# Patient Record
Sex: Male | Born: 1960
Health system: Southern US, Community
[De-identification: ages and names within clinical notes are randomized; demographics above are authoritative.]

## PROBLEM LIST (undated history)

## (undated) DIAGNOSIS — L409 Psoriasis, unspecified: Secondary | ICD-10-CM

## (undated) DIAGNOSIS — L309 Dermatitis, unspecified: Secondary | ICD-10-CM

## (undated) DIAGNOSIS — I10 Essential (primary) hypertension: Secondary | ICD-10-CM

## (undated) HISTORY — DX: Dermatitis, unspecified: L30.9

## (undated) HISTORY — DX: Essential (primary) hypertension: I10

## (undated) HISTORY — PX: WISDOM TOOTH EXTRACTION: SHX21

## (undated) HISTORY — PX: CYST EXCISION: SHX5701

## (undated) HISTORY — DX: Psoriasis, unspecified: L40.9

---

## 2016-08-12 DIAGNOSIS — L218 Other seborrheic dermatitis: Secondary | ICD-10-CM | POA: Diagnosis not present

## 2016-08-12 DIAGNOSIS — L400 Psoriasis vulgaris: Secondary | ICD-10-CM | POA: Diagnosis not present

## 2016-09-16 DIAGNOSIS — J309 Allergic rhinitis, unspecified: Secondary | ICD-10-CM | POA: Diagnosis not present

## 2016-09-16 DIAGNOSIS — Z23 Encounter for immunization: Secondary | ICD-10-CM | POA: Diagnosis not present

## 2016-09-16 DIAGNOSIS — I10 Essential (primary) hypertension: Secondary | ICD-10-CM | POA: Diagnosis not present

## 2016-10-28 ENCOUNTER — Encounter: Payer: Self-pay | Admitting: Allergy and Immunology

## 2016-10-28 ENCOUNTER — Ambulatory Visit (INDEPENDENT_AMBULATORY_CARE_PROVIDER_SITE_OTHER): Payer: BLUE CROSS/BLUE SHIELD | Admitting: Allergy and Immunology

## 2016-10-28 ENCOUNTER — Encounter (INDEPENDENT_AMBULATORY_CARE_PROVIDER_SITE_OTHER): Payer: Self-pay

## 2016-10-28 VITALS — BP 134/78 | HR 88 | Temp 98.3°F | Resp 16 | Ht 71.26 in | Wt 270.6 lb

## 2016-10-28 DIAGNOSIS — J3089 Other allergic rhinitis: Secondary | ICD-10-CM | POA: Diagnosis not present

## 2016-10-28 DIAGNOSIS — J301 Allergic rhinitis due to pollen: Secondary | ICD-10-CM | POA: Diagnosis not present

## 2016-10-28 DIAGNOSIS — T781XXA Other adverse food reactions, not elsewhere classified, initial encounter: Secondary | ICD-10-CM

## 2016-10-28 MED ORDER — AZELASTINE HCL 137 MCG/SPRAY NA SOLN: 2.0000 | Freq: Two times a day (BID) | NASAL | 5 refills | Status: DC | PRN

## 2016-10-28 MED ORDER — OLOPATADINE HCL 0.7 % OP SOLN: 1.0000 [drp] | Freq: Every day | OPHTHALMIC | 5 refills | Status: DC | PRN

## 2016-10-28 MED ORDER — MONTELUKAST SODIUM 10 MG PO TABS: ORAL_TABLET | ORAL | 5 refills | Status: DC

## 2016-10-28 NOTE — Patient Instructions (Addendum)
  1. Allergen avoidance  2. Treat and prevent inflammation:   A. continue nasal fluticasone 1-2 sprays each nostril one time per day  B. start montelukast 10 mg tablet 1 time per day  3. If needed:   A. Azelastine - 1-2 sprays each nostril 1-2 times a day  B. Pazeo - one drop each eye one time per day  4. Consider a course of immunotherapy  5. Return to clinic February 2018 or earlier if problem

## 2016-10-28 NOTE — Progress Notes (Signed)
Dear Dr. Barbaraann Barthelankins,  Thank you for referring Shane Sanchez to the [facility] [place] on 10/28/2016.   Below is a summation of this patient's evaluation and recommendations.  Thank you for your referral. I will keep you informed about this patient's response to treatment.   If you have any questions please do not hesitate to contact me.   Sincerely,  Jessica PriestEric J. Leonia Heatherly, MD [facility]   ______________________________________________________________________    NEW PATIENT NOTE  Referring Provider: Clayborn Heronankins, Victoria R, MD Primary Provider: Clayborn HeronVictoria R Rankins, MD Date of office visit: 10/28/2016    Subjective:   Chief Complaint:  Shane Sanchez (DOB: September 13, 1961) is a 55 y.o. male who presents to the clinic on 10/28/2016 with a chief complaint of Allergic Rhinitis  .     HPI: Shane Sanchez presents to this clinic in evaluation of allergies that a been a long-standing issue but have become progressive over the course of the past several years.  He has nasal congestion and sneezing and nasal itching and itchy red watery eyes and head pressure that appears to flare during the spring and most recently the fall season but also exist on a perennial basis for many years duration. Provoking factors for his symptoms include exposure to pollen and exposure to dust. He has tried antihistamine therapy in the past but each one of these agents appear to cause some degree of sedation. He has tried [employer] Industries[employer]onase which does help his symptoms somewhat yet he still remains symptomatic while utilizing this medication.  In addition, he has issues with mouth tingling after eating bananas and apples and pears and peaches and nectarines. This has been a long-standing issue since early in life. He remains away from these foods and has no problems while doing so. It does appear as though he can eat cooked fruit with no problem. He's never had any  associated systemic or constitutional symptoms associated with these reactions.  Past Medical History:  Diagnosis Date  . Eczema   . High blood pressure   . Psoriasis     Past Surgical History:  Procedure Laterality Date  . CYST EXCISION     Sebaceous cyst in back.  . WISDOM TOOTH EXTRACTION        Medication List      fluticasone 50 MCG/ACT nasal spray Commonly known as:  FLONASE SHAKE LQ AND U 1 SPR IEN QD   losartan-hydrochlorothiazide 100-25 MG tablet Commonly known as:  HYZAAR TK 1 T PO QD   MULTIVITAMIN ADULTS PO Take by mouth.   VITAMIN D PO Take by mouth daily.       No Known Allergies  Review of systems negative except as noted in HPI / PMHx or noted below:  Review of Systems  Constitutional: Negative.   HENT: Negative.   Eyes: Negative.   Respiratory: Negative.   Cardiovascular: Negative.   Gastrointestinal: Negative.   Genitourinary: Negative.   Musculoskeletal: Negative.   Skin: Negative.   Neurological: Negative.   Endo/Heme/Allergies: Negative.   Psychiatric/Behavioral: Negative.     Family History  Problem Relation Age of Onset  . High blood pressure Mother   . Stroke Mother   . Stroke Father   . Heart Problems Father     Social History   Social History  . Marital status: Married    Spouse name: N/A  . Number of children: N/A  . Years of education: N/A  Occupational History  . Not on file.   Social History Main Topics  . Smoking status: Former Smoker    Types: Cigarettes    Quit date: 11/16/1993  . Smokeless tobacco: Never Used  . Alcohol use Yes  . Drug use: No  . Sexual activity: Not on file   Other Topics Concern  . Not on file   Social History Narrative  . No narrative on file    Environmental and Social history  Lives in a house with a dry environment, no animals located inside the household, no carpeting in the bedroom, plastic on the bed and pillow, and no smokers located inside the household. He is a  visiting professor at [employer]  Objective:   Vitals:   10/28/16 0913  BP: 134/78  Pulse: 88  Resp: 16  Temp: 98.3 F (36.8 C)   Height: 5' 11.26" (181 cm) Weight: 270 lb 9.6 oz (122.7 kg)  Physical Exam  Constitutional: He is well-developed, well-nourished, and in no distress.  HENT:  Head: Normocephalic. Head is without right periorbital erythema and without left periorbital erythema.  Right Ear: Tympanic membrane, external ear and ear canal normal.  Left Ear: Tympanic membrane, external ear and ear canal normal.  Nose: Nose normal. No mucosal edema or rhinorrhea.  Mouth/Throat: Oropharynx is clear and moist and mucous membranes are normal. No oropharyngeal exudate.  Eyes: Conjunctivae and lids are normal. Pupils are equal, round, and reactive to light.  Neck: Trachea normal. No tracheal deviation present. No thyromegaly present.  Cardiovascular: Normal rate, regular rhythm, S1 normal, S2 normal and normal heart sounds.   No murmur heard. Pulmonary/Chest: Effort normal. No stridor. No tachypnea. No respiratory distress. He has no wheezes. He has no rales. He exhibits no tenderness.  Abdominal: Soft. He exhibits no distension and no mass. There is no hepatosplenomegaly. There is no tenderness. There is no rebound and no guarding.  Musculoskeletal: He exhibits no edema or tenderness.  Lymphadenopathy:       Head (right side): No tonsillar adenopathy present.       Head (left side): No tonsillar adenopathy present.    He has no cervical adenopathy.    He has no axillary adenopathy.  Neurological: He is alert. Gait normal.  Skin: Rash (Hairline eczematous dermatitis with scale) noted. He is not diaphoretic. No erythema. No pallor. Nails show no clubbing.  Psychiatric: Mood and affect normal.    Diagnostics: Allergy skin tests were performed. He demonstrated severe hypersensitivity against weeds and trees and house dust mite.  Assessment and Plan:    1. Acute seasonal allergic  rhinitis due to pollen   2. Allergic rhinitis due to house dust mite   3. Pollen-food allergy, initial encounter     1. Allergen avoidance  2. Treat and prevent inflammation:   A. continue nasal fluticasone 1-2 sprays each nostril one time per day  B. start montelukast 10 mg tablet 1 time per day  3. If needed:   A. Azelastine - 1-2 sprays each nostril 1-2 times a day  B. Pazeo - one drop each eye one time per day  4. Consider a course of immunotherapy  5. Return to clinic February 2018 or earlier if problem  Hopefully with a combination of allergen avoidance measures and consistent use of anti-inflammatory medications Shane Sanchez will do quite well as he goes through the year. However, I'm not very confident that he is going to have good control of his atopic disease with this plan as  he progresses through this upcoming spring season. He would definitely be a candidate for immunotherapy and I've given him some literature on this form of treatment during today's evaluation. He is presently considering this form of therapy. I'll see him back in this clinic in February or earlier if there is a problem  Jessica Priest, MD [facility]

## 2016-12-30 ENCOUNTER — Ambulatory Visit (INDEPENDENT_AMBULATORY_CARE_PROVIDER_SITE_OTHER): Payer: BLUE CROSS/BLUE SHIELD | Admitting: Allergy and Immunology

## 2016-12-30 ENCOUNTER — Encounter: Payer: Self-pay | Admitting: Allergy and Immunology

## 2016-12-30 ENCOUNTER — Encounter (INDEPENDENT_AMBULATORY_CARE_PROVIDER_SITE_OTHER): Payer: Self-pay

## 2016-12-30 VITALS — BP 118/80 | HR 76 | Resp 16

## 2016-12-30 DIAGNOSIS — T781XXA Other adverse food reactions, not elsewhere classified, initial encounter: Secondary | ICD-10-CM | POA: Diagnosis not present

## 2016-12-30 DIAGNOSIS — J301 Allergic rhinitis due to pollen: Secondary | ICD-10-CM

## 2016-12-30 DIAGNOSIS — J3089 Other allergic rhinitis: Secondary | ICD-10-CM

## 2016-12-30 NOTE — Progress Notes (Signed)
Follow-up Note  Referring Provider: Clayborn Heron, MD Primary Provider: Clayborn Heron, MD Date of Office Visit: 12/30/2016  Subjective:   Shane Sanchez (DOB: 07-09-1961) is a 56 y.o. male who [facility] on 12/30/2016 in re-evaluation of the following:  HPI: Shane Sanchez returns to this clinic in reevaluation of his allergic rhinoconjunctivitis and oral pollinosis syndrome. I last saw him in this clinic on 10/28/2016. He's been using his nasal fluticasone and has also performed some house dust avoidance measures. At this point in time he thinks that he is doing relatively well. He is interested in starting a course immunotherapy because he always has a difficult time during the spring even in the face of utilizing multiple medications.  Allergies as of 12/30/2016   No Known Allergies     Medication List      Azelastine HCl 137 MCG/SPRAY Soln Place 2 sprays into both nostrils 2 (two) times daily as needed.   fluticasone 50 MCG/ACT nasal spray Commonly known as:  FLONASE SHAKE LQ AND U 1 SPR IEN QD   losartan-hydrochlorothiazide 100-25 MG tablet Commonly known as:  HYZAAR TK 1 T PO QD   montelukast 10 MG tablet Commonly known as:  SINGULAIR Take one tablet once daily.   MULTIVITAMIN ADULTS PO Take by mouth.   Olopatadine HCl 0.7 % Soln Commonly known as:  PAZEO Place 1 drop into both eyes daily as needed (for itchy eyes).   vitamin C 250 MG tablet Commonly known as:  ASCORBIC ACID Take 250 mg by mouth daily.   VITAMIN D PO Take by mouth daily.       Past Medical History:  Diagnosis Date  . Eczema   . High blood pressure   . Psoriasis     Past Surgical History:  Procedure Laterality Date  . CYST EXCISION     Sebaceous cyst in back.  . WISDOM TOOTH EXTRACTION      Review of systems negative except as noted in HPI / PMHx or noted below:  Review of Systems  Constitutional: Negative.   HENT: Negative.   Eyes:  Negative.   Respiratory: Negative.   Cardiovascular: Negative.   Gastrointestinal: Negative.   Genitourinary: Negative.   Musculoskeletal: Negative.   Skin: Negative.   Neurological: Negative.   Endo/Heme/Allergies: Negative.   Psychiatric/Behavioral: Negative.      Objective:   Vitals:   12/30/16 1016  BP: 118/80  Pulse: 76  Resp: 16          Physical Exam  Constitutional: He is well-developed, well-nourished, and in no distress.  HENT:  Head: Normocephalic.  Right Ear: Tympanic membrane, external ear and ear canal normal.  Left Ear: Tympanic membrane, external ear and ear canal normal.  Nose: Nose normal. No mucosal edema or rhinorrhea.  Mouth/Throat: Uvula is midline, oropharynx is clear and moist and mucous membranes are normal. No oropharyngeal exudate.  Eyes: Conjunctivae are normal.  Neck: Trachea normal. No tracheal tenderness present. No tracheal deviation present. No thyromegaly present.  Cardiovascular: Normal rate, regular rhythm, S1 normal, S2 normal and normal heart sounds.   No murmur heard. Pulmonary/Chest: Breath sounds normal. No stridor. No respiratory distress. He has no wheezes. He has no rales.  Musculoskeletal: He exhibits no edema.  Lymphadenopathy:       Head (right side): No tonsillar adenopathy present.       Head (left side): No tonsillar adenopathy present.    He has no cervical adenopathy.  Neurological: He is alert. Gait normal.  Skin: No rash noted. He is not diaphoretic. No erythema. Nails show no clubbing.  Psychiatric: Mood and affect normal.    Diagnostics: none   Assessment and Plan:   1. Acute seasonal allergic rhinitis due to pollen   2. Allergic rhinitis due to house dust mite   3. Pollen-food allergy, initial encounter     1. Continue to perform Allergen avoidance measures as best as possible  2. Continue to Treat and prevent inflammation:   A. nasal fluticasone 1-2 sprays each nostril one time per day  B.  montelukast 10 mg tablet 1 time per day  3. If needed:   A. Azelastine - 1-2 sprays each nostril 1-2 times a day  B. Pazeo - one drop each eye one time per day  4. Start a course of immunotherapy  5. Return to clinic 6 months or earlier if problem  Shane Sanchez would definitely be a candidate for immunotherapy given the fact that he has significant problems during pollination season and also has a history of oral pollinosis syndrome that has not responded adequately to good medical treatment. He'll be starting this form of therapy sometime over the course of the next week or so. I will see him back in this clinic in approximately 6 months or earlier if there is a problem.  Laurette SchimkeEric Jasun Gasparini, MD Allergy / Immunology [facility]

## 2016-12-30 NOTE — Patient Instructions (Signed)
  1. Continue to perform Allergen avoidance measures as best as possible  2. Continue to Treat and prevent inflammation:   A. nasal fluticasone 1-2 sprays each nostril one time per day  B. montelukast 10 mg tablet 1 time per day  3. If needed:   A. Azelastine - 1-2 sprays each nostril 1-2 times a day  B. Pazeo - one drop each eye one time per day  4. Start a course of immunotherapy  5. Return to clinic 6 months or earlier if problem

## 2017-01-13 DIAGNOSIS — H40013 Open angle with borderline findings, low risk, bilateral: Secondary | ICD-10-CM | POA: Diagnosis not present

## 2017-01-22 ENCOUNTER — Other Ambulatory Visit: Payer: Self-pay | Admitting: Allergy and Immunology

## 2017-01-22 DIAGNOSIS — J3089 Other allergic rhinitis: Secondary | ICD-10-CM

## 2017-01-22 DIAGNOSIS — J301 Allergic rhinitis due to pollen: Secondary | ICD-10-CM | POA: Diagnosis not present

## 2017-01-23 ENCOUNTER — Encounter: Payer: Self-pay | Admitting: *Deleted

## 2017-01-23 DIAGNOSIS — J3089 Other allergic rhinitis: Secondary | ICD-10-CM | POA: Diagnosis not present

## 2017-01-23 NOTE — Progress Notes (Signed)
4 vial set made. Exp: 01/23/18. hc 

## 2017-01-27 ENCOUNTER — Ambulatory Visit (INDEPENDENT_AMBULATORY_CARE_PROVIDER_SITE_OTHER): Payer: BLUE CROSS/BLUE SHIELD

## 2017-01-27 DIAGNOSIS — J301 Allergic rhinitis due to pollen: Secondary | ICD-10-CM

## 2017-01-27 DIAGNOSIS — J3089 Other allergic rhinitis: Secondary | ICD-10-CM

## 2017-01-27 MED ORDER — EPINEPHRINE 0.3 MG/0.3ML IJ SOAJ: 0.3000 mg | Freq: Once | INTRAMUSCULAR | 1 refills | Status: AC

## 2017-01-27 NOTE — Progress Notes (Signed)
Immunotherapy   Patient Details  Name: Shane Sanchez Spagnolo MRN: [id number] Date of Birth: [birth year]  01/27/2017  Shane ChafePeter Bourquin started injections for  DUST MITE/WEED/TREE Following schedule: B  Frequency:2 times per week Epi-Pen:Prescription for Epi-Pen given Consent signed and patient instructions given.   Dorathy DaftKayla I Zan Triska 01/27/2017, 10:11 AM

## 2017-02-03 ENCOUNTER — Ambulatory Visit (INDEPENDENT_AMBULATORY_CARE_PROVIDER_SITE_OTHER): Payer: BLUE CROSS/BLUE SHIELD | Admitting: *Deleted

## 2017-02-03 DIAGNOSIS — J309 Allergic rhinitis, unspecified: Secondary | ICD-10-CM

## 2017-02-10 ENCOUNTER — Ambulatory Visit (INDEPENDENT_AMBULATORY_CARE_PROVIDER_SITE_OTHER): Payer: BLUE CROSS/BLUE SHIELD | Admitting: *Deleted

## 2017-02-10 DIAGNOSIS — J309 Allergic rhinitis, unspecified: Secondary | ICD-10-CM | POA: Diagnosis not present

## 2017-02-17 ENCOUNTER — Ambulatory Visit (INDEPENDENT_AMBULATORY_CARE_PROVIDER_SITE_OTHER): Payer: BLUE CROSS/BLUE SHIELD | Admitting: *Deleted

## 2017-02-17 DIAGNOSIS — J309 Allergic rhinitis, unspecified: Secondary | ICD-10-CM

## 2017-02-24 ENCOUNTER — Ambulatory Visit (INDEPENDENT_AMBULATORY_CARE_PROVIDER_SITE_OTHER): Payer: BLUE CROSS/BLUE SHIELD | Admitting: *Deleted

## 2017-02-24 DIAGNOSIS — J309 Allergic rhinitis, unspecified: Secondary | ICD-10-CM | POA: Diagnosis not present

## 2017-03-03 ENCOUNTER — Ambulatory Visit (INDEPENDENT_AMBULATORY_CARE_PROVIDER_SITE_OTHER): Payer: BLUE CROSS/BLUE SHIELD | Admitting: *Deleted

## 2017-03-03 DIAGNOSIS — J309 Allergic rhinitis, unspecified: Secondary | ICD-10-CM

## 2017-03-10 ENCOUNTER — Ambulatory Visit (INDEPENDENT_AMBULATORY_CARE_PROVIDER_SITE_OTHER): Payer: BLUE CROSS/BLUE SHIELD | Admitting: *Deleted

## 2017-03-10 DIAGNOSIS — J309 Allergic rhinitis, unspecified: Secondary | ICD-10-CM

## 2017-03-17 ENCOUNTER — Ambulatory Visit (INDEPENDENT_AMBULATORY_CARE_PROVIDER_SITE_OTHER): Payer: BLUE CROSS/BLUE SHIELD | Admitting: *Deleted

## 2017-03-17 DIAGNOSIS — J309 Allergic rhinitis, unspecified: Secondary | ICD-10-CM

## 2017-03-24 ENCOUNTER — Ambulatory Visit (INDEPENDENT_AMBULATORY_CARE_PROVIDER_SITE_OTHER): Payer: BLUE CROSS/BLUE SHIELD | Admitting: *Deleted

## 2017-03-24 DIAGNOSIS — J309 Allergic rhinitis, unspecified: Secondary | ICD-10-CM

## 2017-03-31 ENCOUNTER — Ambulatory Visit (INDEPENDENT_AMBULATORY_CARE_PROVIDER_SITE_OTHER): Payer: BLUE CROSS/BLUE SHIELD

## 2017-03-31 DIAGNOSIS — J309 Allergic rhinitis, unspecified: Secondary | ICD-10-CM

## 2017-04-07 ENCOUNTER — Ambulatory Visit (INDEPENDENT_AMBULATORY_CARE_PROVIDER_SITE_OTHER): Payer: BLUE CROSS/BLUE SHIELD | Admitting: *Deleted

## 2017-04-07 DIAGNOSIS — J309 Allergic rhinitis, unspecified: Secondary | ICD-10-CM

## 2017-04-08 DIAGNOSIS — E782 Mixed hyperlipidemia: Secondary | ICD-10-CM | POA: Diagnosis not present

## 2017-04-08 DIAGNOSIS — I10 Essential (primary) hypertension: Secondary | ICD-10-CM | POA: Diagnosis not present

## 2017-04-08 DIAGNOSIS — J309 Allergic rhinitis, unspecified: Secondary | ICD-10-CM | POA: Diagnosis not present

## 2017-04-08 DIAGNOSIS — L409 Psoriasis, unspecified: Secondary | ICD-10-CM | POA: Diagnosis not present

## 2017-04-08 DIAGNOSIS — Z0001 Encounter for general adult medical examination with abnormal findings: Secondary | ICD-10-CM | POA: Diagnosis not present

## 2017-04-14 ENCOUNTER — Ambulatory Visit (INDEPENDENT_AMBULATORY_CARE_PROVIDER_SITE_OTHER): Payer: BLUE CROSS/BLUE SHIELD

## 2017-04-14 DIAGNOSIS — J309 Allergic rhinitis, unspecified: Secondary | ICD-10-CM | POA: Diagnosis not present

## 2017-04-21 ENCOUNTER — Ambulatory Visit (INDEPENDENT_AMBULATORY_CARE_PROVIDER_SITE_OTHER): Payer: BLUE CROSS/BLUE SHIELD | Admitting: *Deleted

## 2017-04-21 DIAGNOSIS — J309 Allergic rhinitis, unspecified: Secondary | ICD-10-CM | POA: Diagnosis not present

## 2017-04-28 ENCOUNTER — Ambulatory Visit (INDEPENDENT_AMBULATORY_CARE_PROVIDER_SITE_OTHER): Payer: BLUE CROSS/BLUE SHIELD | Admitting: *Deleted

## 2017-04-28 DIAGNOSIS — J309 Allergic rhinitis, unspecified: Secondary | ICD-10-CM

## 2017-05-05 ENCOUNTER — Ambulatory Visit (INDEPENDENT_AMBULATORY_CARE_PROVIDER_SITE_OTHER): Payer: BLUE CROSS/BLUE SHIELD | Admitting: *Deleted

## 2017-05-05 DIAGNOSIS — J309 Allergic rhinitis, unspecified: Secondary | ICD-10-CM

## 2017-05-12 ENCOUNTER — Ambulatory Visit (INDEPENDENT_AMBULATORY_CARE_PROVIDER_SITE_OTHER): Payer: BLUE CROSS/BLUE SHIELD

## 2017-05-12 DIAGNOSIS — J309 Allergic rhinitis, unspecified: Secondary | ICD-10-CM

## 2017-05-19 ENCOUNTER — Ambulatory Visit (INDEPENDENT_AMBULATORY_CARE_PROVIDER_SITE_OTHER): Payer: BLUE CROSS/BLUE SHIELD

## 2017-05-19 DIAGNOSIS — J309 Allergic rhinitis, unspecified: Secondary | ICD-10-CM

## 2017-05-26 ENCOUNTER — Ambulatory Visit (INDEPENDENT_AMBULATORY_CARE_PROVIDER_SITE_OTHER): Payer: BLUE CROSS/BLUE SHIELD | Admitting: *Deleted

## 2017-05-26 DIAGNOSIS — J309 Allergic rhinitis, unspecified: Secondary | ICD-10-CM | POA: Diagnosis not present

## 2017-06-03 ENCOUNTER — Ambulatory Visit (INDEPENDENT_AMBULATORY_CARE_PROVIDER_SITE_OTHER): Payer: BLUE CROSS/BLUE SHIELD

## 2017-06-03 DIAGNOSIS — J309 Allergic rhinitis, unspecified: Secondary | ICD-10-CM

## 2017-06-10 ENCOUNTER — Ambulatory Visit (INDEPENDENT_AMBULATORY_CARE_PROVIDER_SITE_OTHER): Payer: BLUE CROSS/BLUE SHIELD

## 2017-06-10 DIAGNOSIS — J309 Allergic rhinitis, unspecified: Secondary | ICD-10-CM

## 2017-06-18 ENCOUNTER — Ambulatory Visit (INDEPENDENT_AMBULATORY_CARE_PROVIDER_SITE_OTHER): Payer: BLUE CROSS/BLUE SHIELD | Admitting: *Deleted

## 2017-06-18 DIAGNOSIS — J309 Allergic rhinitis, unspecified: Secondary | ICD-10-CM

## 2017-06-26 ENCOUNTER — Ambulatory Visit (INDEPENDENT_AMBULATORY_CARE_PROVIDER_SITE_OTHER): Payer: BLUE CROSS/BLUE SHIELD | Admitting: *Deleted

## 2017-06-26 DIAGNOSIS — J309 Allergic rhinitis, unspecified: Secondary | ICD-10-CM | POA: Diagnosis not present

## 2017-07-02 ENCOUNTER — Ambulatory Visit (INDEPENDENT_AMBULATORY_CARE_PROVIDER_SITE_OTHER): Payer: BLUE CROSS/BLUE SHIELD | Admitting: *Deleted

## 2017-07-02 DIAGNOSIS — J309 Allergic rhinitis, unspecified: Secondary | ICD-10-CM | POA: Diagnosis not present

## 2017-07-03 ENCOUNTER — Other Ambulatory Visit: Payer: Self-pay | Admitting: Allergy and Immunology

## 2017-07-08 ENCOUNTER — Ambulatory Visit (INDEPENDENT_AMBULATORY_CARE_PROVIDER_SITE_OTHER): Payer: BLUE CROSS/BLUE SHIELD | Admitting: *Deleted

## 2017-07-08 DIAGNOSIS — J309 Allergic rhinitis, unspecified: Secondary | ICD-10-CM

## 2017-07-14 ENCOUNTER — Ambulatory Visit (INDEPENDENT_AMBULATORY_CARE_PROVIDER_SITE_OTHER): Payer: BLUE CROSS/BLUE SHIELD

## 2017-07-14 DIAGNOSIS — J309 Allergic rhinitis, unspecified: Secondary | ICD-10-CM | POA: Diagnosis not present

## 2017-07-21 ENCOUNTER — Ambulatory Visit (INDEPENDENT_AMBULATORY_CARE_PROVIDER_SITE_OTHER): Payer: BLUE CROSS/BLUE SHIELD

## 2017-07-21 DIAGNOSIS — J309 Allergic rhinitis, unspecified: Secondary | ICD-10-CM | POA: Diagnosis not present

## 2017-07-23 NOTE — Progress Notes (Signed)
2 Mt vials made Exp. 07/24/18 

## 2017-07-24 DIAGNOSIS — J3089 Other allergic rhinitis: Secondary | ICD-10-CM | POA: Diagnosis not present

## 2017-07-28 ENCOUNTER — Ambulatory Visit (INDEPENDENT_AMBULATORY_CARE_PROVIDER_SITE_OTHER): Payer: BLUE CROSS/BLUE SHIELD | Admitting: *Deleted

## 2017-07-28 DIAGNOSIS — J309 Allergic rhinitis, unspecified: Secondary | ICD-10-CM

## 2017-08-04 ENCOUNTER — Ambulatory Visit (INDEPENDENT_AMBULATORY_CARE_PROVIDER_SITE_OTHER): Payer: BLUE CROSS/BLUE SHIELD | Admitting: *Deleted

## 2017-08-04 DIAGNOSIS — J309 Allergic rhinitis, unspecified: Secondary | ICD-10-CM

## 2017-08-12 ENCOUNTER — Ambulatory Visit (INDEPENDENT_AMBULATORY_CARE_PROVIDER_SITE_OTHER): Payer: BLUE CROSS/BLUE SHIELD

## 2017-08-12 DIAGNOSIS — J309 Allergic rhinitis, unspecified: Secondary | ICD-10-CM | POA: Diagnosis not present

## 2017-08-19 ENCOUNTER — Ambulatory Visit (INDEPENDENT_AMBULATORY_CARE_PROVIDER_SITE_OTHER): Payer: BLUE CROSS/BLUE SHIELD

## 2017-08-19 DIAGNOSIS — J309 Allergic rhinitis, unspecified: Secondary | ICD-10-CM | POA: Diagnosis not present

## 2017-09-09 ENCOUNTER — Ambulatory Visit (INDEPENDENT_AMBULATORY_CARE_PROVIDER_SITE_OTHER): Payer: BLUE CROSS/BLUE SHIELD | Admitting: *Deleted

## 2017-09-09 DIAGNOSIS — J309 Allergic rhinitis, unspecified: Secondary | ICD-10-CM | POA: Diagnosis not present

## 2017-09-15 ENCOUNTER — Ambulatory Visit (INDEPENDENT_AMBULATORY_CARE_PROVIDER_SITE_OTHER): Payer: BLUE CROSS/BLUE SHIELD | Admitting: *Deleted

## 2017-09-15 DIAGNOSIS — J309 Allergic rhinitis, unspecified: Secondary | ICD-10-CM | POA: Diagnosis not present

## 2017-09-22 ENCOUNTER — Ambulatory Visit (INDEPENDENT_AMBULATORY_CARE_PROVIDER_SITE_OTHER): Payer: BLUE CROSS/BLUE SHIELD

## 2017-09-22 DIAGNOSIS — J309 Allergic rhinitis, unspecified: Secondary | ICD-10-CM

## 2017-09-30 ENCOUNTER — Ambulatory Visit (INDEPENDENT_AMBULATORY_CARE_PROVIDER_SITE_OTHER): Payer: BLUE CROSS/BLUE SHIELD | Admitting: *Deleted

## 2017-09-30 DIAGNOSIS — J309 Allergic rhinitis, unspecified: Secondary | ICD-10-CM

## 2017-10-06 ENCOUNTER — Ambulatory Visit (INDEPENDENT_AMBULATORY_CARE_PROVIDER_SITE_OTHER): Payer: BLUE CROSS/BLUE SHIELD

## 2017-10-06 DIAGNOSIS — J309 Allergic rhinitis, unspecified: Secondary | ICD-10-CM

## 2017-10-13 ENCOUNTER — Ambulatory Visit (INDEPENDENT_AMBULATORY_CARE_PROVIDER_SITE_OTHER): Payer: BLUE CROSS/BLUE SHIELD | Admitting: *Deleted

## 2017-10-13 DIAGNOSIS — J309 Allergic rhinitis, unspecified: Secondary | ICD-10-CM

## 2017-10-20 ENCOUNTER — Ambulatory Visit (INDEPENDENT_AMBULATORY_CARE_PROVIDER_SITE_OTHER): Payer: BLUE CROSS/BLUE SHIELD | Admitting: *Deleted

## 2017-10-20 DIAGNOSIS — J309 Allergic rhinitis, unspecified: Secondary | ICD-10-CM | POA: Diagnosis not present

## 2017-10-30 ENCOUNTER — Ambulatory Visit (INDEPENDENT_AMBULATORY_CARE_PROVIDER_SITE_OTHER): Payer: BLUE CROSS/BLUE SHIELD

## 2017-10-30 DIAGNOSIS — J309 Allergic rhinitis, unspecified: Secondary | ICD-10-CM

## 2017-11-04 ENCOUNTER — Encounter: Payer: Self-pay | Admitting: *Deleted

## 2017-11-04 NOTE — Progress Notes (Signed)
VIALS MADE. EXP: 11-04-18. HV 

## 2017-11-06 ENCOUNTER — Ambulatory Visit (INDEPENDENT_AMBULATORY_CARE_PROVIDER_SITE_OTHER): Payer: BLUE CROSS/BLUE SHIELD

## 2017-11-06 DIAGNOSIS — J309 Allergic rhinitis, unspecified: Secondary | ICD-10-CM | POA: Diagnosis not present

## 2017-11-11 DIAGNOSIS — J3089 Other allergic rhinitis: Secondary | ICD-10-CM | POA: Diagnosis not present

## 2017-11-18 ENCOUNTER — Ambulatory Visit (INDEPENDENT_AMBULATORY_CARE_PROVIDER_SITE_OTHER): Payer: BLUE CROSS/BLUE SHIELD | Admitting: *Deleted

## 2017-11-18 DIAGNOSIS — J309 Allergic rhinitis, unspecified: Secondary | ICD-10-CM | POA: Diagnosis not present

## 2017-12-01 ENCOUNTER — Ambulatory Visit (INDEPENDENT_AMBULATORY_CARE_PROVIDER_SITE_OTHER): Payer: BLUE CROSS/BLUE SHIELD | Admitting: *Deleted

## 2017-12-01 DIAGNOSIS — J309 Allergic rhinitis, unspecified: Secondary | ICD-10-CM | POA: Diagnosis not present

## 2017-12-13 ENCOUNTER — Other Ambulatory Visit: Payer: Self-pay | Admitting: Allergy and Immunology

## 2017-12-14 NOTE — Telephone Encounter (Signed)
Courtesy refill  

## 2017-12-15 ENCOUNTER — Ambulatory Visit (INDEPENDENT_AMBULATORY_CARE_PROVIDER_SITE_OTHER): Payer: BLUE CROSS/BLUE SHIELD

## 2017-12-15 DIAGNOSIS — J309 Allergic rhinitis, unspecified: Secondary | ICD-10-CM | POA: Diagnosis not present

## 2017-12-29 ENCOUNTER — Ambulatory Visit (INDEPENDENT_AMBULATORY_CARE_PROVIDER_SITE_OTHER): Payer: BLUE CROSS/BLUE SHIELD | Admitting: *Deleted

## 2017-12-29 DIAGNOSIS — J309 Allergic rhinitis, unspecified: Secondary | ICD-10-CM

## 2018-01-11 ENCOUNTER — Other Ambulatory Visit: Payer: Self-pay | Admitting: Allergy and Immunology

## 2018-01-12 ENCOUNTER — Ambulatory Visit (INDEPENDENT_AMBULATORY_CARE_PROVIDER_SITE_OTHER): Payer: BLUE CROSS/BLUE SHIELD | Admitting: *Deleted

## 2018-01-12 DIAGNOSIS — J309 Allergic rhinitis, unspecified: Secondary | ICD-10-CM | POA: Diagnosis not present

## 2018-01-12 MED ORDER — MONTELUKAST SODIUM 10 MG PO TABS: 10.0000 mg | ORAL_TABLET | Freq: Every day | ORAL | 0 refills | Status: DC

## 2018-01-26 ENCOUNTER — Ambulatory Visit (INDEPENDENT_AMBULATORY_CARE_PROVIDER_SITE_OTHER): Payer: BLUE CROSS/BLUE SHIELD | Admitting: *Deleted

## 2018-01-26 DIAGNOSIS — J309 Allergic rhinitis, unspecified: Secondary | ICD-10-CM | POA: Diagnosis not present

## 2018-02-02 ENCOUNTER — Ambulatory Visit (INDEPENDENT_AMBULATORY_CARE_PROVIDER_SITE_OTHER): Payer: BLUE CROSS/BLUE SHIELD | Admitting: *Deleted

## 2018-02-02 DIAGNOSIS — J309 Allergic rhinitis, unspecified: Secondary | ICD-10-CM | POA: Diagnosis not present

## 2018-02-09 ENCOUNTER — Ambulatory Visit: Payer: BLUE CROSS/BLUE SHIELD | Admitting: Allergy and Immunology

## 2018-02-09 ENCOUNTER — Ambulatory Visit: Payer: Self-pay | Admitting: *Deleted

## 2018-02-09 ENCOUNTER — Encounter: Payer: Self-pay | Admitting: Allergy and Immunology

## 2018-02-09 VITALS — BP 138/84 | HR 88 | Resp 20

## 2018-02-09 DIAGNOSIS — J3089 Other allergic rhinitis: Secondary | ICD-10-CM

## 2018-02-09 DIAGNOSIS — J309 Allergic rhinitis, unspecified: Secondary | ICD-10-CM

## 2018-02-09 MED ORDER — FLUTICASONE PROPIONATE 50 MCG/ACT NA SUSP: 2.0000 | Freq: Every day | NASAL | 11 refills | Status: DC

## 2018-02-09 MED ORDER — MONTELUKAST SODIUM 10 MG PO TABS: 10.0000 mg | ORAL_TABLET | Freq: Every day | ORAL | 11 refills | Status: DC

## 2018-02-09 NOTE — Patient Instructions (Addendum)
  1. Continue to perform Allergen avoidance measures as best as possible  2. Continue to Treat and prevent inflammation:   A. nasal fluticasone 1-2 sprays each nostril one time per day  B. montelukast 10 mg tablet 1 time per day  3. If needed:   A. Azelastine - 1-2 sprays each nostril 1-2 times a day  B. Pazeo - one drop each eye one time per day  C.  OTC antihistamine  4.  Continue immunotherapy and EpiPen/Auvi-Q  5. Return to clinic 12 months or earlier if problem

## 2018-02-09 NOTE — Progress Notes (Signed)
Follow-up Note  Referring Provider: Clayborn Heronankins, Victoria R, MD Primary Provider: Clayborn Heronankins, Victoria R, MD Date of Office Visit: 02/09/2018  Subjective:   Shane Sanchez (DOB: 05-25-1961) is a 57 y.o. male who [facility] on 02/09/2018 in re-evaluation of the following:  HPI: Shane Sanchez returns to this clinic in reevaluation of his allergic rhinoconjunctivitis and oral pollinosis syndrome.  He was last seen in this clinic 30 December 2016 at which point in time he started a course of immunotherapy.  So far he has had an excellent response to immunotherapy and through this early spring he has had no problems at all with his nose and no problems with his eyes.  He consistently uses nasal fluticasone and montelukast and has no need to use any other medications for his atopic disease.  He has not had any adverse effect from his immunotherapy.  Currently he is at every 2-week administration.  Allergies as of 02/09/2018   No Known Allergies     Medication List      atorvastatin 40 MG tablet Commonly known as:  LIPITOR TK 1 T PO QD   fluticasone 50 MCG/ACT nasal spray Commonly known as:  FLONASE SHAKE LQ AND U 1 SPR IEN QD   losartan-hydrochlorothiazide 100-25 MG tablet Commonly known as:  HYZAAR TK 1 T PO QD   montelukast 10 MG tablet Commonly known as:  SINGULAIR Take 1 tablet (10 mg total) by mouth daily.       Past Medical History:  Diagnosis Date  . Eczema   . High blood pressure   . Psoriasis     Past Surgical History:  Procedure Laterality Date  . CYST EXCISION     Sebaceous cyst in back.  . WISDOM TOOTH EXTRACTION      Review of systems negative except as noted in HPI / PMHx or noted below:  Review of Systems  Constitutional: Negative.   HENT: Negative.   Eyes: Negative.   Respiratory: Negative.   Cardiovascular: Negative.   Gastrointestinal: Negative.   Genitourinary: Negative.   Musculoskeletal: Negative.   Skin: Negative.     Neurological: Negative.   Endo/Heme/Allergies: Negative.   Psychiatric/Behavioral: Negative.      Objective:   Vitals:   02/09/18 1552  BP: 138/84  Pulse: 88  Resp: 20          Physical Exam  Constitutional: He is well-developed, well-nourished, and in no distress.  HENT:  Head: Normocephalic.  Right Ear: Tympanic membrane, external ear and ear canal normal.  Left Ear: Tympanic membrane, external ear and ear canal normal.  Nose: Nose normal. No mucosal edema or rhinorrhea.  Mouth/Throat: Uvula is midline, oropharynx is clear and moist and mucous membranes are normal. No oropharyngeal exudate.  Eyes: Conjunctivae are normal.  Neck: Trachea normal. No tracheal tenderness present. No tracheal deviation present. No thyromegaly present.  Cardiovascular: Normal rate, regular rhythm, S1 normal, S2 normal and normal heart sounds.  No murmur heard. Pulmonary/Chest: Breath sounds normal. No stridor. No respiratory distress. He has no wheezes. He has no rales.  Musculoskeletal: He exhibits no edema.  Lymphadenopathy:       Head (right side): No tonsillar adenopathy present.       Head (left side): No tonsillar adenopathy present.    He has no cervical adenopathy.  Neurological: He is alert. Gait normal.  Skin: Rash (Psoriatic lesions scalp, hairline, small patches extremities.) noted. He is not diaphoretic. No erythema. Nails show no clubbing.  Psychiatric: Mood and affect normal.    Diagnostics: none  Assessment and Plan:   1. Other allergic rhinitis     1. Continue to perform Allergen avoidance measures as best as possible  2. Continue to Treat and prevent inflammation:   A. nasal fluticasone 1-2 sprays each nostril one time per day  B. montelukast 10 mg tablet 1 time per day  3. If needed:   A. Azelastine - 1-2 sprays each nostril 1-2 times a day  B. Pazeo - one drop each eye one time per day  C.  OTC antihistamine  4.  Continue immunotherapy and  EpiPen/Auvi-Q  5. Return to clinic 12 months or earlier if problem  [name] appears to be doing very well on his current plan which includes immunotherapy.  I suspect that he will have a relatively good response directed against his atopic disease with this form of treatment.  I will see him back in his clinic in 12 months or earlier if there is a problem.  Laurette Schimke, MD Allergy / Immunology [facility]

## 2018-02-10 ENCOUNTER — Encounter: Payer: Self-pay | Admitting: Allergy and Immunology

## 2018-02-23 ENCOUNTER — Ambulatory Visit (INDEPENDENT_AMBULATORY_CARE_PROVIDER_SITE_OTHER): Payer: BLUE CROSS/BLUE SHIELD | Admitting: *Deleted

## 2018-02-23 DIAGNOSIS — J309 Allergic rhinitis, unspecified: Secondary | ICD-10-CM

## 2018-03-03 ENCOUNTER — Encounter: Payer: Self-pay | Admitting: *Deleted

## 2018-03-03 NOTE — Progress Notes (Signed)
Maintenance vial made. Exp: 03-04-19. hv 

## 2018-03-09 ENCOUNTER — Ambulatory Visit (INDEPENDENT_AMBULATORY_CARE_PROVIDER_SITE_OTHER): Payer: BLUE CROSS/BLUE SHIELD | Admitting: *Deleted

## 2018-03-09 DIAGNOSIS — J309 Allergic rhinitis, unspecified: Secondary | ICD-10-CM

## 2018-03-10 DIAGNOSIS — J3089 Other allergic rhinitis: Secondary | ICD-10-CM | POA: Diagnosis not present

## 2018-03-23 ENCOUNTER — Ambulatory Visit (INDEPENDENT_AMBULATORY_CARE_PROVIDER_SITE_OTHER): Payer: BLUE CROSS/BLUE SHIELD | Admitting: *Deleted

## 2018-03-23 DIAGNOSIS — J309 Allergic rhinitis, unspecified: Secondary | ICD-10-CM | POA: Diagnosis not present

## 2018-03-26 DIAGNOSIS — H40023 Open angle with borderline findings, high risk, bilateral: Secondary | ICD-10-CM | POA: Diagnosis not present

## 2018-04-06 ENCOUNTER — Ambulatory Visit (INDEPENDENT_AMBULATORY_CARE_PROVIDER_SITE_OTHER): Payer: BLUE CROSS/BLUE SHIELD | Admitting: *Deleted

## 2018-04-06 DIAGNOSIS — J309 Allergic rhinitis, unspecified: Secondary | ICD-10-CM

## 2018-04-14 DIAGNOSIS — L409 Psoriasis, unspecified: Secondary | ICD-10-CM | POA: Diagnosis not present

## 2018-04-14 DIAGNOSIS — Z23 Encounter for immunization: Secondary | ICD-10-CM | POA: Diagnosis not present

## 2018-04-14 DIAGNOSIS — E782 Mixed hyperlipidemia: Secondary | ICD-10-CM | POA: Diagnosis not present

## 2018-04-14 DIAGNOSIS — R7301 Impaired fasting glucose: Secondary | ICD-10-CM | POA: Diagnosis not present

## 2018-04-14 DIAGNOSIS — I10 Essential (primary) hypertension: Secondary | ICD-10-CM | POA: Diagnosis not present

## 2018-04-14 DIAGNOSIS — Z0000 Encounter for general adult medical examination without abnormal findings: Secondary | ICD-10-CM | POA: Diagnosis not present

## 2018-04-16 ENCOUNTER — Other Ambulatory Visit: Payer: Self-pay | Admitting: Family Medicine

## 2018-04-16 DIAGNOSIS — N6320 Unspecified lump in the left breast, unspecified quadrant: Secondary | ICD-10-CM

## 2018-04-23 ENCOUNTER — Ambulatory Visit (INDEPENDENT_AMBULATORY_CARE_PROVIDER_SITE_OTHER): Payer: BLUE CROSS/BLUE SHIELD

## 2018-04-23 DIAGNOSIS — J309 Allergic rhinitis, unspecified: Secondary | ICD-10-CM

## 2018-04-26 ENCOUNTER — Ambulatory Visit
Admission: RE | Admit: 2018-04-26 | Discharge: 2018-04-26 | Disposition: A | Discharge status: Home / Self Care | Payer: BLUE CROSS/BLUE SHIELD | Source: Ambulatory Visit | Attending: Family Medicine | Admitting: Family Medicine

## 2018-04-26 DIAGNOSIS — N6320 Unspecified lump in the left breast, unspecified quadrant: Secondary | ICD-10-CM

## 2018-04-26 DIAGNOSIS — N6489 Other specified disorders of breast: Secondary | ICD-10-CM | POA: Diagnosis not present

## 2018-04-26 DIAGNOSIS — R928 Other abnormal and inconclusive findings on diagnostic imaging of breast: Secondary | ICD-10-CM | POA: Diagnosis not present

## 2018-04-30 DIAGNOSIS — E119 Type 2 diabetes mellitus without complications: Secondary | ICD-10-CM | POA: Diagnosis not present

## 2018-05-06 ENCOUNTER — Ambulatory Visit (INDEPENDENT_AMBULATORY_CARE_PROVIDER_SITE_OTHER): Payer: BLUE CROSS/BLUE SHIELD | Admitting: *Deleted

## 2018-05-06 DIAGNOSIS — J309 Allergic rhinitis, unspecified: Secondary | ICD-10-CM | POA: Diagnosis not present

## 2018-05-13 DIAGNOSIS — Z713 Dietary counseling and surveillance: Secondary | ICD-10-CM | POA: Diagnosis not present

## 2018-05-14 ENCOUNTER — Ambulatory Visit (INDEPENDENT_AMBULATORY_CARE_PROVIDER_SITE_OTHER): Payer: BLUE CROSS/BLUE SHIELD

## 2018-05-14 DIAGNOSIS — J309 Allergic rhinitis, unspecified: Secondary | ICD-10-CM

## 2018-05-26 ENCOUNTER — Ambulatory Visit (INDEPENDENT_AMBULATORY_CARE_PROVIDER_SITE_OTHER): Payer: BLUE CROSS/BLUE SHIELD | Admitting: *Deleted

## 2018-05-26 DIAGNOSIS — J309 Allergic rhinitis, unspecified: Secondary | ICD-10-CM

## 2018-06-01 ENCOUNTER — Ambulatory Visit (INDEPENDENT_AMBULATORY_CARE_PROVIDER_SITE_OTHER): Payer: BLUE CROSS/BLUE SHIELD | Admitting: *Deleted

## 2018-06-01 DIAGNOSIS — J309 Allergic rhinitis, unspecified: Secondary | ICD-10-CM

## 2018-06-23 DIAGNOSIS — Z713 Dietary counseling and surveillance: Secondary | ICD-10-CM | POA: Diagnosis not present

## 2018-06-24 ENCOUNTER — Ambulatory Visit (INDEPENDENT_AMBULATORY_CARE_PROVIDER_SITE_OTHER): Payer: BLUE CROSS/BLUE SHIELD | Admitting: *Deleted

## 2018-06-24 DIAGNOSIS — J309 Allergic rhinitis, unspecified: Secondary | ICD-10-CM

## 2018-07-06 ENCOUNTER — Ambulatory Visit (INDEPENDENT_AMBULATORY_CARE_PROVIDER_SITE_OTHER): Payer: BLUE CROSS/BLUE SHIELD | Admitting: *Deleted

## 2018-07-06 DIAGNOSIS — J309 Allergic rhinitis, unspecified: Secondary | ICD-10-CM | POA: Diagnosis not present

## 2018-07-21 ENCOUNTER — Ambulatory Visit (INDEPENDENT_AMBULATORY_CARE_PROVIDER_SITE_OTHER): Payer: BLUE CROSS/BLUE SHIELD

## 2018-07-21 DIAGNOSIS — J309 Allergic rhinitis, unspecified: Secondary | ICD-10-CM

## 2018-08-03 ENCOUNTER — Ambulatory Visit (INDEPENDENT_AMBULATORY_CARE_PROVIDER_SITE_OTHER): Payer: BLUE CROSS/BLUE SHIELD | Admitting: *Deleted

## 2018-08-03 DIAGNOSIS — J309 Allergic rhinitis, unspecified: Secondary | ICD-10-CM

## 2018-08-12 NOTE — Progress Notes (Signed)
Vials exp 08-14-19 

## 2018-08-13 DIAGNOSIS — J3089 Other allergic rhinitis: Secondary | ICD-10-CM | POA: Diagnosis not present

## 2018-08-17 ENCOUNTER — Ambulatory Visit (INDEPENDENT_AMBULATORY_CARE_PROVIDER_SITE_OTHER): Payer: BLUE CROSS/BLUE SHIELD | Admitting: *Deleted

## 2018-08-17 DIAGNOSIS — J309 Allergic rhinitis, unspecified: Secondary | ICD-10-CM

## 2018-08-19 DIAGNOSIS — E119 Type 2 diabetes mellitus without complications: Secondary | ICD-10-CM | POA: Diagnosis not present

## 2018-08-19 DIAGNOSIS — L409 Psoriasis, unspecified: Secondary | ICD-10-CM | POA: Diagnosis not present

## 2018-08-19 DIAGNOSIS — N62 Hypertrophy of breast: Secondary | ICD-10-CM | POA: Diagnosis not present

## 2018-08-19 DIAGNOSIS — Z23 Encounter for immunization: Secondary | ICD-10-CM | POA: Diagnosis not present

## 2018-08-19 DIAGNOSIS — E782 Mixed hyperlipidemia: Secondary | ICD-10-CM | POA: Diagnosis not present

## 2018-09-02 ENCOUNTER — Ambulatory Visit (INDEPENDENT_AMBULATORY_CARE_PROVIDER_SITE_OTHER): Payer: BLUE CROSS/BLUE SHIELD | Admitting: *Deleted

## 2018-09-02 DIAGNOSIS — J309 Allergic rhinitis, unspecified: Secondary | ICD-10-CM | POA: Diagnosis not present

## 2018-09-14 ENCOUNTER — Ambulatory Visit (INDEPENDENT_AMBULATORY_CARE_PROVIDER_SITE_OTHER): Payer: BLUE CROSS/BLUE SHIELD | Admitting: *Deleted

## 2018-09-14 DIAGNOSIS — J309 Allergic rhinitis, unspecified: Secondary | ICD-10-CM

## 2018-09-14 DIAGNOSIS — E291 Testicular hypofunction: Secondary | ICD-10-CM | POA: Diagnosis not present

## 2018-09-14 DIAGNOSIS — N62 Hypertrophy of breast: Secondary | ICD-10-CM | POA: Diagnosis not present

## 2018-09-14 DIAGNOSIS — R7309 Other abnormal glucose: Secondary | ICD-10-CM | POA: Diagnosis not present

## 2018-09-28 ENCOUNTER — Ambulatory Visit (INDEPENDENT_AMBULATORY_CARE_PROVIDER_SITE_OTHER): Payer: BLUE CROSS/BLUE SHIELD | Admitting: *Deleted

## 2018-09-28 DIAGNOSIS — J309 Allergic rhinitis, unspecified: Secondary | ICD-10-CM

## 2018-10-05 DIAGNOSIS — H40023 Open angle with borderline findings, high risk, bilateral: Secondary | ICD-10-CM | POA: Diagnosis not present

## 2018-10-05 DIAGNOSIS — Z86010 Personal history of colonic polyps: Secondary | ICD-10-CM | POA: Diagnosis not present

## 2018-10-05 DIAGNOSIS — Z01818 Encounter for other preprocedural examination: Secondary | ICD-10-CM | POA: Diagnosis not present

## 2018-10-12 ENCOUNTER — Ambulatory Visit (INDEPENDENT_AMBULATORY_CARE_PROVIDER_SITE_OTHER): Payer: BLUE CROSS/BLUE SHIELD | Admitting: *Deleted

## 2018-10-12 DIAGNOSIS — J309 Allergic rhinitis, unspecified: Secondary | ICD-10-CM | POA: Diagnosis not present

## 2018-10-26 ENCOUNTER — Ambulatory Visit (INDEPENDENT_AMBULATORY_CARE_PROVIDER_SITE_OTHER): Payer: BLUE CROSS/BLUE SHIELD | Admitting: *Deleted

## 2018-10-26 DIAGNOSIS — J309 Allergic rhinitis, unspecified: Secondary | ICD-10-CM

## 2018-10-26 DIAGNOSIS — R3911 Hesitancy of micturition: Secondary | ICD-10-CM | POA: Diagnosis not present

## 2018-10-26 DIAGNOSIS — N401 Enlarged prostate with lower urinary tract symptoms: Secondary | ICD-10-CM | POA: Diagnosis not present

## 2018-10-26 DIAGNOSIS — E291 Testicular hypofunction: Secondary | ICD-10-CM | POA: Diagnosis not present

## 2018-10-28 DIAGNOSIS — Z79899 Other long term (current) drug therapy: Secondary | ICD-10-CM | POA: Diagnosis not present

## 2018-10-28 DIAGNOSIS — E291 Testicular hypofunction: Secondary | ICD-10-CM | POA: Diagnosis not present

## 2018-10-28 DIAGNOSIS — R7309 Other abnormal glucose: Secondary | ICD-10-CM | POA: Diagnosis not present

## 2018-10-28 DIAGNOSIS — Z6839 Body mass index (BMI) 39.0-39.9, adult: Secondary | ICD-10-CM | POA: Diagnosis not present

## 2018-10-28 DIAGNOSIS — N62 Hypertrophy of breast: Secondary | ICD-10-CM | POA: Diagnosis not present

## 2018-11-08 ENCOUNTER — Ambulatory Visit (INDEPENDENT_AMBULATORY_CARE_PROVIDER_SITE_OTHER): Payer: BLUE CROSS/BLUE SHIELD

## 2018-11-08 DIAGNOSIS — J309 Allergic rhinitis, unspecified: Secondary | ICD-10-CM

## 2018-11-09 DIAGNOSIS — D123 Benign neoplasm of transverse colon: Secondary | ICD-10-CM | POA: Diagnosis not present

## 2018-11-09 DIAGNOSIS — D122 Benign neoplasm of ascending colon: Secondary | ICD-10-CM | POA: Diagnosis not present

## 2018-11-09 DIAGNOSIS — K644 Residual hemorrhoidal skin tags: Secondary | ICD-10-CM | POA: Diagnosis not present

## 2018-11-09 DIAGNOSIS — Z86010 Personal history of colonic polyps: Secondary | ICD-10-CM | POA: Diagnosis not present

## 2018-11-09 DIAGNOSIS — D125 Benign neoplasm of sigmoid colon: Secondary | ICD-10-CM | POA: Diagnosis not present

## 2018-11-09 DIAGNOSIS — K5730 Diverticulosis of large intestine without perforation or abscess without bleeding: Secondary | ICD-10-CM | POA: Diagnosis not present

## 2018-11-09 DIAGNOSIS — K648 Other hemorrhoids: Secondary | ICD-10-CM | POA: Diagnosis not present

## 2018-11-16 DIAGNOSIS — D122 Benign neoplasm of ascending colon: Secondary | ICD-10-CM | POA: Diagnosis not present

## 2018-11-16 DIAGNOSIS — D125 Benign neoplasm of sigmoid colon: Secondary | ICD-10-CM | POA: Diagnosis not present

## 2018-11-16 DIAGNOSIS — D123 Benign neoplasm of transverse colon: Secondary | ICD-10-CM | POA: Diagnosis not present

## 2018-11-18 DIAGNOSIS — N62 Hypertrophy of breast: Secondary | ICD-10-CM | POA: Diagnosis not present

## 2018-11-18 DIAGNOSIS — R7309 Other abnormal glucose: Secondary | ICD-10-CM | POA: Diagnosis not present

## 2018-11-18 DIAGNOSIS — E291 Testicular hypofunction: Secondary | ICD-10-CM | POA: Diagnosis not present

## 2018-11-23 ENCOUNTER — Ambulatory Visit (INDEPENDENT_AMBULATORY_CARE_PROVIDER_SITE_OTHER): Payer: BLUE CROSS/BLUE SHIELD | Admitting: *Deleted

## 2018-11-23 DIAGNOSIS — J309 Allergic rhinitis, unspecified: Secondary | ICD-10-CM

## 2018-11-24 DIAGNOSIS — Z6839 Body mass index (BMI) 39.0-39.9, adult: Secondary | ICD-10-CM | POA: Diagnosis not present

## 2018-11-24 DIAGNOSIS — E291 Testicular hypofunction: Secondary | ICD-10-CM | POA: Diagnosis not present

## 2018-11-24 DIAGNOSIS — N62 Hypertrophy of breast: Secondary | ICD-10-CM | POA: Diagnosis not present

## 2018-11-24 DIAGNOSIS — R7309 Other abnormal glucose: Secondary | ICD-10-CM | POA: Diagnosis not present

## 2018-12-09 ENCOUNTER — Ambulatory Visit (INDEPENDENT_AMBULATORY_CARE_PROVIDER_SITE_OTHER): Payer: BLUE CROSS/BLUE SHIELD

## 2018-12-09 DIAGNOSIS — J309 Allergic rhinitis, unspecified: Secondary | ICD-10-CM | POA: Diagnosis not present

## 2018-12-21 ENCOUNTER — Ambulatory Visit (INDEPENDENT_AMBULATORY_CARE_PROVIDER_SITE_OTHER): Payer: BLUE CROSS/BLUE SHIELD

## 2018-12-21 DIAGNOSIS — J309 Allergic rhinitis, unspecified: Secondary | ICD-10-CM

## 2019-01-05 ENCOUNTER — Ambulatory Visit (INDEPENDENT_AMBULATORY_CARE_PROVIDER_SITE_OTHER): Payer: BLUE CROSS/BLUE SHIELD | Admitting: *Deleted

## 2019-01-05 DIAGNOSIS — J309 Allergic rhinitis, unspecified: Secondary | ICD-10-CM

## 2019-01-05 NOTE — Progress Notes (Signed)
VIALS EXP 2-29-2021

## 2019-01-06 ENCOUNTER — Other Ambulatory Visit: Payer: Self-pay | Admitting: Internal Medicine

## 2019-01-06 DIAGNOSIS — Z8639 Personal history of other endocrine, nutritional and metabolic disease: Secondary | ICD-10-CM

## 2019-01-06 DIAGNOSIS — J3089 Other allergic rhinitis: Secondary | ICD-10-CM | POA: Diagnosis not present

## 2019-01-16 ENCOUNTER — Other Ambulatory Visit: Payer: Self-pay | Admitting: Allergy and Immunology

## 2019-01-17 ENCOUNTER — Other Ambulatory Visit: Payer: Self-pay | Admitting: Allergy and Immunology

## 2019-01-17 MED ORDER — MONTELUKAST SODIUM 10 MG PO TABS: ORAL_TABLET | ORAL | 0 refills | Status: DC

## 2019-01-17 MED ORDER — FLUTICASONE PROPIONATE 50 MCG/ACT NA SUSP: 2.0000 | Freq: Every day | NASAL | 0 refills | Status: DC

## 2019-01-17 NOTE — Telephone Encounter (Signed)
Refills sent in wife notified

## 2019-01-17 NOTE — Telephone Encounter (Signed)
Patients wife is calling stating that patient needs refills on montelukast and flonase called into the [facility] [address]  Patient does have an upcoming appt on 03/17 for yearly and allergy injections

## 2019-01-18 ENCOUNTER — Ambulatory Visit (INDEPENDENT_AMBULATORY_CARE_PROVIDER_SITE_OTHER): Payer: BLUE CROSS/BLUE SHIELD | Admitting: *Deleted

## 2019-01-18 DIAGNOSIS — J309 Allergic rhinitis, unspecified: Secondary | ICD-10-CM

## 2019-02-01 ENCOUNTER — Ambulatory Visit (INDEPENDENT_AMBULATORY_CARE_PROVIDER_SITE_OTHER): Payer: BLUE CROSS/BLUE SHIELD | Admitting: Allergy and Immunology

## 2019-02-01 ENCOUNTER — Encounter: Payer: Self-pay | Admitting: Allergy and Immunology

## 2019-02-01 ENCOUNTER — Other Ambulatory Visit: Payer: Self-pay

## 2019-02-01 VITALS — BP 138/74 | HR 90 | Resp 18 | Ht 71.5 in | Wt 285.0 lb

## 2019-02-01 DIAGNOSIS — J3089 Other allergic rhinitis: Secondary | ICD-10-CM | POA: Diagnosis not present

## 2019-02-01 DIAGNOSIS — J301 Allergic rhinitis due to pollen: Secondary | ICD-10-CM | POA: Diagnosis not present

## 2019-02-01 DIAGNOSIS — T781XXD Other adverse food reactions, not elsewhere classified, subsequent encounter: Secondary | ICD-10-CM | POA: Diagnosis not present

## 2019-02-01 MED ORDER — FLUTICASONE PROPIONATE 50 MCG/ACT NA SUSP: 2.0000 | Freq: Every day | NASAL | 2 refills | Status: DC

## 2019-02-01 MED ORDER — MONTELUKAST SODIUM 10 MG PO TABS: ORAL_TABLET | ORAL | 2 refills | Status: DC

## 2019-02-01 NOTE — Progress Notes (Signed)
[name] [facility] [place] [facility] [place] [facility] [place] [facility] [place]   Follow-up Note  Referring Provider: Clayborn Heron, MD Primary Provider: Clayborn Heron, MD Date of Office Visit: 02/01/2019  Subjective:   Shane Sanchez (DOB: July 18, 1961) is a 58 y.o. male who returns to the [facility] on 02/01/2019 in re-evaluation of the following:  HPI: Shane Sanchez presents to this clinic in evaluation of allergic rhinoconjunctivitis and oral allergy syndrome treated with immunotherapy.  His last visit to this clinic was 09 February 2018.  Overall he has done very well with his allergic rhinoconjunctivitis while using immunotherapy and has not had any new adverse effect from utilizing this form of treatment.  Currently has treatment is every 2 weeks.  He has noticed for the past 2 weeks has had an episode of sneezing and some nasal pressure and a relatively mild sore throat without any other symptoms including fever or ugly nasal discharge or cough.  This occurs even though he consistently uses his Flonase and his montelukast.  He has not added in any additional antihistamine at this point in time.  Since starting his immunotherapy he can now eat apples without any problem at all.  He did obtain the flu vaccine this year.  Allergies as of 02/01/2019   No Known Allergies     Medication List      atorvastatin 40 MG tablet Commonly known as:  LIPITOR TK 1 T PO QD   fluticasone 50 MCG/ACT nasal spray Commonly known as:  FLONASE Place 2 sprays into both nostrils daily.   losartan-hydrochlorothiazide 100-25 MG tablet Commonly known as:  HYZAAR TK 1 T PO QD   montelukast 10 MG tablet Commonly known as:  SINGULAIR TAKE 1 TABLET(10 MG) BY MOUTH DAILY       Past Medical History:  Diagnosis Date  . Eczema   . High blood pressure   . Psoriasis     Past Surgical History:  Procedure Laterality Date  . CYST EXCISION     Sebaceous cyst in back.  . WISDOM TOOTH EXTRACTION      Review of systems negative except as noted in HPI / PMHx or noted below:  Review of Systems  Constitutional: Negative.   HENT: Negative.   Eyes: Negative.   Respiratory: Negative.   Cardiovascular: Negative.   Gastrointestinal: Negative.   Genitourinary: Negative.   Musculoskeletal: Negative.   Skin: Negative.   Neurological: Negative.   Endo/Heme/Allergies: Negative.   Psychiatric/Behavioral: Negative.      Objective:   Vitals:   02/01/19 1746  BP: 138/74  Pulse: 90  Resp: 18  SpO2: 98%   Height: 5' 11.5" (181.6 cm)  Weight: 285 lb (129.3 kg)   Physical Exam Constitutional:      Appearance: He is not diaphoretic.  HENT:     Head: Normocephalic.     Right Ear: Tympanic membrane, ear canal and external ear normal.     Left Ear: Tympanic membrane, ear canal and external ear normal.     Nose: Nose normal. No mucosal edema or rhinorrhea.     Mouth/Throat:     Pharynx: Uvula midline. No oropharyngeal exudate.  Eyes:     Conjunctiva/sclera: Conjunctivae normal.  Neck:     Thyroid: No thyromegaly.     Trachea: Trachea normal. No tracheal tenderness or tracheal deviation.  Cardiovascular:     Rate and Rhythm: Normal rate and regular rhythm.     Heart sounds: Normal heart sounds, S1 normal and S2  normal. No murmur.  Pulmonary:     Effort: No respiratory distress.     Breath sounds: Normal breath sounds. No stridor. No wheezing or rales.  Lymphadenopathy:     Head:     Right side of head: No tonsillar adenopathy.     Left side of head: No tonsillar adenopathy.     Cervical: No cervical adenopathy.  Skin:    Nails: There is no clubbing.   Neurological:     Mental Status: He is alert.     Diagnostics: none  Assessment and Plan:   1. Perennial allergic rhinitis   2. Seasonal allergic rhinitis due to pollen   3. Pollen-food allergy, subsequent encounter     1. Continue to perform Allergen avoidance measures as best as possible  2. Continue to Treat and  prevent inflammation:   A. nasal fluticasone 1-2 sprays each nostril one time per day  B. montelukast 10 mg tablet 1 time per day  3. If needed:   A. OTC antihistamine  4.  Continue immunotherapy and EpiPen/Auvi-Q  5. Return to clinic 12 months or earlier if problem  [name] appears to be doing quite well on his current plan and he will remain on immunotherapy and anti-inflammatory agents for his airway and we will see him back in this clinic in approximately 12 months or earlier if there is a problem.  Laurette Schimke, MD Allergy / Immunology [facility]

## 2019-02-01 NOTE — Patient Instructions (Addendum)
  1. Continue to perform Allergen avoidance measures as best as possible  2. Continue to Treat and prevent inflammation:   A. nasal fluticasone 1-2 sprays each nostril one time per day  B. montelukast 10 mg tablet 1 time per day  3. If needed:   A. OTC antihistamine  4.  Continue immunotherapy and EpiPen/Auvi-Q  5. Return to clinic 12 months or earlier if problem

## 2019-02-02 ENCOUNTER — Encounter: Payer: Self-pay | Admitting: Allergy and Immunology

## 2019-02-08 ENCOUNTER — Ambulatory Visit (INDEPENDENT_AMBULATORY_CARE_PROVIDER_SITE_OTHER): Payer: BLUE CROSS/BLUE SHIELD | Admitting: *Deleted

## 2019-02-08 DIAGNOSIS — J3089 Other allergic rhinitis: Secondary | ICD-10-CM | POA: Diagnosis not present

## 2019-02-22 ENCOUNTER — Ambulatory Visit (INDEPENDENT_AMBULATORY_CARE_PROVIDER_SITE_OTHER): Payer: BLUE CROSS/BLUE SHIELD | Admitting: *Deleted

## 2019-02-22 DIAGNOSIS — J309 Allergic rhinitis, unspecified: Secondary | ICD-10-CM | POA: Diagnosis not present

## 2019-03-07 ENCOUNTER — Ambulatory Visit (INDEPENDENT_AMBULATORY_CARE_PROVIDER_SITE_OTHER): Payer: BLUE CROSS/BLUE SHIELD

## 2019-03-07 DIAGNOSIS — J309 Allergic rhinitis, unspecified: Secondary | ICD-10-CM | POA: Diagnosis not present

## 2019-03-18 ENCOUNTER — Ambulatory Visit (INDEPENDENT_AMBULATORY_CARE_PROVIDER_SITE_OTHER): Payer: BLUE CROSS/BLUE SHIELD | Admitting: *Deleted

## 2019-03-18 DIAGNOSIS — J309 Allergic rhinitis, unspecified: Secondary | ICD-10-CM

## 2019-04-04 ENCOUNTER — Ambulatory Visit (INDEPENDENT_AMBULATORY_CARE_PROVIDER_SITE_OTHER): Payer: BLUE CROSS/BLUE SHIELD

## 2019-04-04 DIAGNOSIS — J309 Allergic rhinitis, unspecified: Secondary | ICD-10-CM | POA: Diagnosis not present

## 2019-04-26 ENCOUNTER — Ambulatory Visit (INDEPENDENT_AMBULATORY_CARE_PROVIDER_SITE_OTHER): Payer: BLUE CROSS/BLUE SHIELD | Admitting: *Deleted

## 2019-04-26 DIAGNOSIS — J309 Allergic rhinitis, unspecified: Secondary | ICD-10-CM

## 2019-05-09 DIAGNOSIS — I10 Essential (primary) hypertension: Secondary | ICD-10-CM | POA: Diagnosis not present

## 2019-05-09 DIAGNOSIS — E118 Type 2 diabetes mellitus with unspecified complications: Secondary | ICD-10-CM | POA: Diagnosis not present

## 2019-05-09 DIAGNOSIS — E782 Mixed hyperlipidemia: Secondary | ICD-10-CM | POA: Diagnosis not present

## 2019-05-09 DIAGNOSIS — Z0000 Encounter for general adult medical examination without abnormal findings: Secondary | ICD-10-CM | POA: Diagnosis not present

## 2019-05-09 DIAGNOSIS — F1099 Alcohol use, unspecified with unspecified alcohol-induced disorder: Secondary | ICD-10-CM | POA: Diagnosis not present

## 2019-05-09 DIAGNOSIS — Z125 Encounter for screening for malignant neoplasm of prostate: Secondary | ICD-10-CM | POA: Diagnosis not present

## 2019-05-09 DIAGNOSIS — Z23 Encounter for immunization: Secondary | ICD-10-CM | POA: Diagnosis not present

## 2019-05-18 ENCOUNTER — Ambulatory Visit (INDEPENDENT_AMBULATORY_CARE_PROVIDER_SITE_OTHER): Payer: BLUE CROSS/BLUE SHIELD | Admitting: *Deleted

## 2019-05-18 DIAGNOSIS — J309 Allergic rhinitis, unspecified: Secondary | ICD-10-CM

## 2019-05-25 DIAGNOSIS — J3089 Other allergic rhinitis: Secondary | ICD-10-CM | POA: Diagnosis not present

## 2019-05-25 NOTE — Progress Notes (Signed)
VIALS EXP 05-24-2020 

## 2019-06-07 ENCOUNTER — Ambulatory Visit (INDEPENDENT_AMBULATORY_CARE_PROVIDER_SITE_OTHER): Payer: BC Managed Care – PPO

## 2019-06-07 DIAGNOSIS — J309 Allergic rhinitis, unspecified: Secondary | ICD-10-CM | POA: Diagnosis not present

## 2019-06-28 ENCOUNTER — Ambulatory Visit (INDEPENDENT_AMBULATORY_CARE_PROVIDER_SITE_OTHER): Payer: BC Managed Care – PPO | Admitting: *Deleted

## 2019-06-28 DIAGNOSIS — J309 Allergic rhinitis, unspecified: Secondary | ICD-10-CM

## 2019-07-19 ENCOUNTER — Ambulatory Visit (INDEPENDENT_AMBULATORY_CARE_PROVIDER_SITE_OTHER): Payer: BC Managed Care – PPO | Admitting: *Deleted

## 2019-07-19 ENCOUNTER — Other Ambulatory Visit: Payer: Self-pay

## 2019-07-19 DIAGNOSIS — J309 Allergic rhinitis, unspecified: Secondary | ICD-10-CM

## 2019-07-21 ENCOUNTER — Encounter: Payer: Self-pay | Admitting: Internal Medicine

## 2019-07-21 ENCOUNTER — Other Ambulatory Visit: Payer: Self-pay

## 2019-07-21 ENCOUNTER — Ambulatory Visit: Payer: BC Managed Care – PPO | Admitting: Internal Medicine

## 2019-07-21 VITALS — BP 118/64 | HR 86 | Temp 98.3°F | Ht 72.0 in | Wt 282.6 lb

## 2019-07-21 DIAGNOSIS — N62 Hypertrophy of breast: Secondary | ICD-10-CM | POA: Diagnosis not present

## 2019-07-21 NOTE — Progress Notes (Signed)
Name: Shane Sanchez  MRN/ DOB: [id number], [birth year]    Age/ Sex: 58 y.o., male    PCP: Cari Caraway, MD   Reason for Endocrinology Evaluation: Hypogonadism / gynecomastia      Date of Initial Endocrinology Evaluation: 07/21/2019     HPI: Shane Sanchez is a 58 y.o. male with a past medical history of HTN and T2DM . The patient presented for initial endocrinology clinic visit on 07/21/2019 for consultative assistance with his Gynecomastia   Pt is here with his wife today  Pt noted a left lump under the nipple ~ >1  yr ago ( sometime in 03/2018), associated with sensitivity but does not recall having a nipple discharge  Denies decreased shaving   Can not tell if  there's a decrease in testicular  size   No change in libido , no erectile dysfunction   Saw urology and confirmed prostate hypertrophy   Breast mammo and ultrasound showed gynecomastia 6.2019   Drinks a bottle and half a day ( wine) daily   Pt is a college professor   HISTORY:  Past Medical History:  Past Medical History:  Diagnosis Date  . Eczema   . High blood pressure   . Psoriasis    Past Surgical History:  Past Surgical History:  Procedure Laterality Date  . CYST EXCISION     Sebaceous cyst in back.  . WISDOM TOOTH EXTRACTION        Social History:  reports that he quit smoking about 25 years ago. His smoking use included cigarettes. He has never used smokeless tobacco. He reports current alcohol use. He reports that he does not use drugs.  Family History: family history includes Heart Problems in his father; High blood pressure in his mother; Stroke in his father and mother.   HOME MEDICATIONS: Allergies as of 07/21/2019   No Known Allergies     Medication List       Accurate as of July 21, 2019  1:39 PM. If you have any questions, ask your nurse or doctor.        atorvastatin 40 MG tablet Commonly known as: LIPITOR TK 1 T PO QD   fluticasone 50 MCG/ACT nasal spray  Commonly known as: FLONASE Place 2 sprays into both nostrils daily.   losartan-hydrochlorothiazide 100-25 MG tablet Commonly known as: HYZAAR TK 1 T PO QD   metFORMIN 500 MG tablet Commonly known as: GLUCOPHAGE TK 1 T PO QD WAC   montelukast 10 MG tablet Commonly known as: SINGULAIR TAKE 1 TABLET(10 MG) BY MOUTH DAILY         REVIEW OF SYSTEMS: A comprehensive ROS was conducted with the patient and is negative except as per HPI and below:  Review of Systems  Constitutional: Negative for chills and fever.  HENT: Negative for nosebleeds and sore throat.   Eyes: Negative for blurred vision and pain.  Respiratory: Negative for cough and shortness of breath.   Cardiovascular: Negative for chest pain and palpitations.  Gastrointestinal: Negative for diarrhea and nausea.  Genitourinary: Positive for frequency.  Neurological: Negative for tingling and tremors.  Endo/Heme/Allergies: Negative for polydipsia.       OBJECTIVE:  VS: BP 118/64 (BP Location: Left Arm, Patient Position: Sitting, Cuff Size: Large)   Pulse 86   Temp 98.3 F (36.8 C)   Ht 6' (1.829 m)   Wt 282 lb 9.6 oz (128.2 kg)   SpO2 96%   BMI 38.33 kg/m  Wt Readings from Last 3 Encounters:  07/21/19 282 lb 9.6 oz (128.2 kg)  02/01/19 285 lb (129.3 kg)  10/28/16 270 lb 9.6 oz (122.7 kg)    EXAM: General: Pt appears well and is in NAD  Hydration: Well-hydrated with moist mucous membranes and good skin turgor  Eyes: External eye exam normal without stare, lid lag or exophthalmos.  EOM intact.   Ears, Nose, Throat: Hearing: Grossly intact bilaterally Dental: Good dentition  Throat: Clear without mass, erythema or exudate  Neck: General: Supple without adenopathy. Thyroid: Thyroid size normal.  No goiter or nodules appreciated. No thyroid bruit.  Breast exam:  Mild gynecomastia on the left, no glandular tissue on the right   Lungs: Clear with good BS bilat with no rales, rhonchi, or wheezes  Heart:  Auscultation: RRR.  Abdomen: Normoactive bowel sounds, soft, nontender, without masses or organomegaly palpable  Genital : Benign penile exam, right testicle 20 mL, left testicle 25mL   Extremities: BL LE: Trace pretibial edema  Skin: Hair: Texture and amount normal with gender appropriate distribution Skin Inspection: No rashes Skin Palpation: Skin temperature, texture, and thickness normal to palpation  Neuro: Cranial nerves: II - XII grossly intact  Motor: Normal strength throughout DTRs: 2+ and symmetric in UE without delay in relaxation phase  Mental Status: Judgment, insight: Intact Orientation: Oriented to time, place, and person Mood and affect: No depression, anxiety, or agitation     DATA REVIEWED: 08/20/19  Testosterone 151.3 ng/dL  PSA 1.300.52 ng/mL     ASSESSMENT/PLAN/RECOMMENDATIONS:   1. Gynecomastia :   - We discussed that gynecomastia is due to an imbalance between  estrogen and testosterone ratio in males.  - He did see an endocrinologist last year (these records are nor available)  - We did discuss the role of adipose tissue and the role of excessive alcohol intake in extraglandular aromatization of testosterone to estrogen.  - We discussed the need to repeat hormonal testing , we discussed the endocrine guidelines in testing early in the morning and in the fasting status.  - If there's an underlying hormonal excess or deficiency, this will be treated appropriately but as far as gynecomastia , once its been there for > 1 yr, there's fibrotic tissue and will not disappear unless surgically removed.  - We discussed side effects of testosterone therapy such as erythropoiesis, worsening of sleep apnea that is severe and untreated,  Prostate volumes and serum PSA increase in response to testosterone treatment which might increase BPH and worsen urinary outflow obstruction as well as prostate cancer risk.  - We also discussed there is a possibility of increased  cardiovascular risk associated with testosterone use.   Pt will stop by for repeat labs at his convenience    F/u in 3 months   Signed electronically by: Lyndle HerrlichAbby Jaralla Elo Marmolejos, MD  [facility]  [facility] [address] [place], [place] [address] Phone: [phone] FAX: [phone]   CC: Gweneth DimitriMcNeill, Wendy, MD [address] [place] [address] Phone: [phone] Fax: [phone]   Return to Endocrinology clinic as below: Future Appointments  Date Time Provider Department Center  07/26/2019 11:45 AM [facility] None  10/20/2019 10:30 AM [place] Brasington, Konrad DoloresIbtehal Jaralla, MD [facility] None

## 2019-07-21 NOTE — Patient Instructions (Signed)
-   Please stop by the lab at 8 AM , fasting.

## 2019-07-22 ENCOUNTER — Encounter: Payer: Self-pay | Admitting: Internal Medicine

## 2019-07-26 ENCOUNTER — Ambulatory Visit (INDEPENDENT_AMBULATORY_CARE_PROVIDER_SITE_OTHER): Payer: BC Managed Care – PPO | Admitting: *Deleted

## 2019-07-26 ENCOUNTER — Other Ambulatory Visit (INDEPENDENT_AMBULATORY_CARE_PROVIDER_SITE_OTHER): Payer: BC Managed Care – PPO

## 2019-07-26 ENCOUNTER — Other Ambulatory Visit: Payer: Self-pay

## 2019-07-26 DIAGNOSIS — J309 Allergic rhinitis, unspecified: Secondary | ICD-10-CM

## 2019-07-26 DIAGNOSIS — N62 Hypertrophy of breast: Secondary | ICD-10-CM

## 2019-07-26 LAB — T4, FREE: Free T4: 0.64 ng/dL (ref 0.60–1.60)

## 2019-07-26 LAB — CBC WITH DIFFERENTIAL/PLATELET
Basophils Absolute: 0 10*3/uL (ref 0.0–0.1)
Basophils Relative: 0.6 % (ref 0.0–3.0)
Eosinophils Absolute: 0.1 10*3/uL (ref 0.0–0.7)
Eosinophils Relative: 1 % (ref 0.0–5.0)
HCT: 43.8 % (ref 39.0–52.0)
Hemoglobin: 14.7 g/dL (ref 13.0–17.0)
Lymphocytes Relative: 35.8 % (ref 12.0–46.0)
Lymphs Abs: 2.7 10*3/uL (ref 0.7–4.0)
MCHC: 33.5 g/dL (ref 30.0–36.0)
MCV: 93.4 fl (ref 78.0–100.0)
Monocytes Absolute: 0.7 10*3/uL (ref 0.1–1.0)
Monocytes Relative: 9 % (ref 3.0–12.0)
Neutro Abs: 4 10*3/uL (ref 1.4–7.7)
Neutrophils Relative %: 53.6 % (ref 43.0–77.0)
Platelets: 217 10*3/uL (ref 150.0–400.0)
RBC: 4.69 Mil/uL (ref 4.22–5.81)
RDW: 13.3 % (ref 11.5–15.5)
WBC: 7.4 10*3/uL (ref 4.0–10.5)

## 2019-07-26 LAB — LUTEINIZING HORMONE: LH: 2.68 m[IU]/mL (ref 1.50–9.30)

## 2019-07-26 LAB — COMPREHENSIVE METABOLIC PANEL
ALT: 39 U/L (ref 0–53)
AST: 39 U/L — ABNORMAL HIGH (ref 0–37)
Albumin: 4.4 g/dL (ref 3.5–5.2)
Alkaline Phosphatase: 74 U/L (ref 39–117)
BUN: 15 mg/dL (ref 6–23)
CO2: 27 mEq/L (ref 19–32)
Calcium: 9.6 mg/dL (ref 8.4–10.5)
Chloride: 97 mEq/L (ref 96–112)
Creatinine, Ser: 0.95 mg/dL (ref 0.40–1.50)
GFR: 81.35 mL/min (ref >60.00)
Glucose, Bld: 133 mg/dL — ABNORMAL HIGH (ref 70–99)
Potassium: 4.2 mEq/L (ref 3.5–5.1)
Sodium: 135 mEq/L (ref 135–145)
Total Bilirubin: 0.6 mg/dL (ref 0.2–1.2)
Total Protein: 7.8 g/dL (ref 6.0–8.3)

## 2019-07-26 LAB — FOLLICLE STIMULATING HORMONE: FSH: 3.6 m[IU]/mL (ref 1.4–18.1)

## 2019-07-26 LAB — TSH: TSH: 2.93 u[IU]/mL (ref 0.35–4.50)

## 2019-08-01 LAB — TESTOSTERONE, TOTAL, LC/MS/MS: Testosterone, total: 222.7 ng/dL — ABNORMAL LOW (ref 264.0–916.0)

## 2019-08-01 LAB — PROLACTIN: Prolactin: 14.3 ng/mL (ref 4.0–15.2)

## 2019-08-01 LAB — ESTRADIOL: Estradiol: 43.7 pg/mL — ABNORMAL HIGH (ref 7.6–42.6)

## 2019-08-01 LAB — TESTOSTERONE FREE MS/DIALYSIS
Free Testosterone, Serum: 40 pg/mL — ABNORMAL LOW
Testosterone, Serum (Total): 221 ng/dL — ABNORMAL LOW
Testosterone-% Free: 1.8 %

## 2019-08-03 ENCOUNTER — Ambulatory Visit (INDEPENDENT_AMBULATORY_CARE_PROVIDER_SITE_OTHER): Payer: BC Managed Care – PPO | Admitting: *Deleted

## 2019-08-03 ENCOUNTER — Telehealth: Payer: Self-pay | Admitting: Internal Medicine

## 2019-08-03 DIAGNOSIS — J309 Allergic rhinitis, unspecified: Secondary | ICD-10-CM | POA: Diagnosis not present

## 2019-08-03 DIAGNOSIS — N62 Hypertrophy of breast: Secondary | ICD-10-CM

## 2019-08-03 NOTE — Telephone Encounter (Signed)
Attempted to call the number in the message but it was the wrong number.    Called [phone] spoke to the patient in the presence of his wife He verified his DOB  We discussed the mild but elevated estradiol level, we also discussed low testosterone  We discussed proceed with testicular ultrasound, if this is negative will proceed with adrenal imaging , once tumors have been ruled out will discuss treatment options.   They both expressed understanding     Abby Nena Jordan, MD  [facility] Endocrinology  [facility] [address] [place], [place] [address] Phone: [phone] FAX: [phone]

## 2019-08-03 NOTE — Telephone Encounter (Signed)
Patients wife is stated for Dr.Shamleffer to call the patient at this number [phone]  Thanks

## 2019-08-09 ENCOUNTER — Ambulatory Visit (INDEPENDENT_AMBULATORY_CARE_PROVIDER_SITE_OTHER): Payer: BC Managed Care – PPO | Admitting: *Deleted

## 2019-08-09 DIAGNOSIS — J309 Allergic rhinitis, unspecified: Secondary | ICD-10-CM

## 2019-08-15 DIAGNOSIS — I10 Essential (primary) hypertension: Secondary | ICD-10-CM | POA: Diagnosis not present

## 2019-08-15 DIAGNOSIS — N62 Hypertrophy of breast: Secondary | ICD-10-CM | POA: Diagnosis not present

## 2019-08-15 DIAGNOSIS — E118 Type 2 diabetes mellitus with unspecified complications: Secondary | ICD-10-CM | POA: Diagnosis not present

## 2019-08-15 DIAGNOSIS — E782 Mixed hyperlipidemia: Secondary | ICD-10-CM | POA: Diagnosis not present

## 2019-08-16 ENCOUNTER — Ambulatory Visit (INDEPENDENT_AMBULATORY_CARE_PROVIDER_SITE_OTHER): Payer: BC Managed Care – PPO | Admitting: *Deleted

## 2019-08-16 DIAGNOSIS — J309 Allergic rhinitis, unspecified: Secondary | ICD-10-CM | POA: Diagnosis not present

## 2019-09-06 ENCOUNTER — Ambulatory Visit (INDEPENDENT_AMBULATORY_CARE_PROVIDER_SITE_OTHER): Payer: BC Managed Care – PPO

## 2019-09-06 ENCOUNTER — Ambulatory Visit
Admission: RE | Admit: 2019-09-06 | Discharge: 2019-09-06 | Disposition: A | Discharge status: Home / Self Care | Payer: BC Managed Care – PPO | Source: Ambulatory Visit | Attending: Internal Medicine | Admitting: Internal Medicine

## 2019-09-06 DIAGNOSIS — N62 Hypertrophy of breast: Secondary | ICD-10-CM

## 2019-09-06 DIAGNOSIS — J309 Allergic rhinitis, unspecified: Secondary | ICD-10-CM

## 2019-09-06 DIAGNOSIS — N433 Hydrocele, unspecified: Secondary | ICD-10-CM | POA: Diagnosis not present

## 2019-09-12 ENCOUNTER — Encounter: Payer: Self-pay | Admitting: Internal Medicine

## 2019-09-12 ENCOUNTER — Other Ambulatory Visit: Payer: Self-pay | Admitting: Internal Medicine

## 2019-09-12 DIAGNOSIS — N62 Hypertrophy of breast: Secondary | ICD-10-CM

## 2019-09-17 ENCOUNTER — Encounter: Payer: Self-pay | Admitting: Allergy and Immunology

## 2019-09-27 ENCOUNTER — Ambulatory Visit (INDEPENDENT_AMBULATORY_CARE_PROVIDER_SITE_OTHER): Payer: BC Managed Care – PPO | Admitting: *Deleted

## 2019-09-27 DIAGNOSIS — J309 Allergic rhinitis, unspecified: Secondary | ICD-10-CM | POA: Diagnosis not present

## 2019-10-03 ENCOUNTER — Ambulatory Visit
Admission: RE | Admit: 2019-10-03 | Discharge: 2019-10-03 | Disposition: A | Discharge status: Home / Self Care | Payer: BC Managed Care – PPO | Source: Ambulatory Visit | Attending: Internal Medicine | Admitting: Internal Medicine

## 2019-10-03 DIAGNOSIS — N62 Hypertrophy of breast: Secondary | ICD-10-CM

## 2019-10-03 DIAGNOSIS — K7689 Other specified diseases of liver: Secondary | ICD-10-CM | POA: Diagnosis not present

## 2019-10-05 ENCOUNTER — Telehealth: Payer: Self-pay | Admitting: Internal Medicine

## 2019-10-05 NOTE — Telephone Encounter (Signed)
  Discussed CT scan results with the pt as below :   CT scan :  Lower chest: No acute abnormality.  Hepatobiliary: Ill-defined low density is seen in the posterior segment of the right hepatic lobe which may represent focal fatty infiltration. No cholelithiasis or biliary dilatation is noted.  Pancreas: Unremarkable. No pancreatic ductal dilatation or surrounding inflammatory changes.  Spleen: Normal in size without focal abnormality.  Adrenals/Urinary Tract: Adrenal glands are unremarkable. Small bilateral renal cysts are noted. No hydronephrosis or renal obstruction is noted. No renal calculi are noted.  Stomach/Bowel: The stomach appears normal. There is no evidence of bowel obstruction or inflammation.  Vascular/Lymphatic: Aortic atherosclerosis. No enlarged abdominal lymph nodes.  Other: No abdominal wall hernia or abnormality.  Musculoskeletal: No acute or significant osseous findings.  IMPRESSION: Ill-defined low density is noted in posterior segment of right hepatic lobe which most likely represents focal hepatic steatosis, but further evaluation with CT or MRI scan with intravenous contrast is recommended to rule out other pathology.  No adrenal abnormality is noted.  Aortic Atherosclerosis (ICD10-I70.0).    We discussed that his testicular and adrenal imaging has been normal. Most likely his elevated estrogen is due to peripheral conversation. We discussed starting testosterone therapy but we also discussed the importance of weight loss and reducing alcohol intake  to reduce peripheral conversion .     Pt opted to hold off on testosterone therapy at this time. He will call us back in 4-6 month for a follow up.   Will cancel next month appointment.    Pt will discuss hepatic fatty focus and aortic calcification with PCP on next visit in January2021     Potter, MD  [facility] Endocrinology  [facility] [place] [place], [place] [address] Phone: [phone] FAX: [phone]

## 2019-10-18 ENCOUNTER — Other Ambulatory Visit: Payer: Self-pay | Admitting: Allergy and Immunology

## 2019-10-18 ENCOUNTER — Ambulatory Visit (INDEPENDENT_AMBULATORY_CARE_PROVIDER_SITE_OTHER): Payer: BC Managed Care – PPO | Admitting: *Deleted

## 2019-10-18 DIAGNOSIS — J309 Allergic rhinitis, unspecified: Secondary | ICD-10-CM | POA: Diagnosis not present

## 2019-10-20 ENCOUNTER — Ambulatory Visit: Payer: BC Managed Care – PPO | Admitting: Internal Medicine

## 2019-11-08 ENCOUNTER — Ambulatory Visit (INDEPENDENT_AMBULATORY_CARE_PROVIDER_SITE_OTHER): Payer: BC Managed Care – PPO

## 2019-11-08 DIAGNOSIS — E538 Deficiency of other specified B group vitamins: Secondary | ICD-10-CM | POA: Diagnosis not present

## 2019-11-08 DIAGNOSIS — E782 Mixed hyperlipidemia: Secondary | ICD-10-CM | POA: Diagnosis not present

## 2019-11-08 DIAGNOSIS — J309 Allergic rhinitis, unspecified: Secondary | ICD-10-CM

## 2019-11-08 DIAGNOSIS — E118 Type 2 diabetes mellitus with unspecified complications: Secondary | ICD-10-CM | POA: Diagnosis not present

## 2019-11-08 DIAGNOSIS — I10 Essential (primary) hypertension: Secondary | ICD-10-CM | POA: Diagnosis not present

## 2019-11-22 DIAGNOSIS — E782 Mixed hyperlipidemia: Secondary | ICD-10-CM | POA: Diagnosis not present

## 2019-11-22 DIAGNOSIS — I10 Essential (primary) hypertension: Secondary | ICD-10-CM | POA: Diagnosis not present

## 2019-11-22 DIAGNOSIS — E669 Obesity, unspecified: Secondary | ICD-10-CM | POA: Diagnosis not present

## 2019-11-22 DIAGNOSIS — E1159 Type 2 diabetes mellitus with other circulatory complications: Secondary | ICD-10-CM | POA: Diagnosis not present

## 2019-11-29 ENCOUNTER — Other Ambulatory Visit: Payer: Self-pay | Admitting: Allergy and Immunology

## 2019-11-30 ENCOUNTER — Ambulatory Visit (INDEPENDENT_AMBULATORY_CARE_PROVIDER_SITE_OTHER): Payer: BC Managed Care – PPO | Admitting: *Deleted

## 2019-11-30 DIAGNOSIS — J309 Allergic rhinitis, unspecified: Secondary | ICD-10-CM | POA: Diagnosis not present

## 2019-12-05 NOTE — Progress Notes (Signed)
Exp 12/06/20 

## 2019-12-07 DIAGNOSIS — J3089 Other allergic rhinitis: Secondary | ICD-10-CM | POA: Diagnosis not present

## 2019-12-20 ENCOUNTER — Ambulatory Visit (INDEPENDENT_AMBULATORY_CARE_PROVIDER_SITE_OTHER): Payer: BC Managed Care – PPO

## 2019-12-20 DIAGNOSIS — J309 Allergic rhinitis, unspecified: Secondary | ICD-10-CM | POA: Diagnosis not present

## 2020-01-10 ENCOUNTER — Ambulatory Visit (INDEPENDENT_AMBULATORY_CARE_PROVIDER_SITE_OTHER): Payer: BC Managed Care – PPO

## 2020-01-10 DIAGNOSIS — J309 Allergic rhinitis, unspecified: Secondary | ICD-10-CM | POA: Diagnosis not present

## 2020-01-12 ENCOUNTER — Ambulatory Visit: Payer: BC Managed Care – PPO | Admitting: Internal Medicine

## 2020-01-13 ENCOUNTER — Other Ambulatory Visit: Payer: Self-pay

## 2020-01-17 ENCOUNTER — Encounter: Payer: Self-pay | Admitting: Internal Medicine

## 2020-01-17 ENCOUNTER — Ambulatory Visit (INDEPENDENT_AMBULATORY_CARE_PROVIDER_SITE_OTHER): Payer: BC Managed Care – PPO

## 2020-01-17 ENCOUNTER — Ambulatory Visit: Payer: BC Managed Care – PPO | Admitting: Internal Medicine

## 2020-01-17 ENCOUNTER — Other Ambulatory Visit: Payer: Self-pay

## 2020-01-17 VITALS — BP 128/88 | HR 82 | Temp 98.1°F | Ht 72.0 in | Wt 275.8 lb

## 2020-01-17 DIAGNOSIS — J309 Allergic rhinitis, unspecified: Secondary | ICD-10-CM | POA: Diagnosis not present

## 2020-01-17 DIAGNOSIS — N62 Hypertrophy of breast: Secondary | ICD-10-CM | POA: Diagnosis not present

## 2020-01-17 NOTE — Progress Notes (Signed)
Name: Shane Sanchez  MRN/ DOB: [id number], 08/08/1961    Age/ Sex: 59 y.o., male     PCP: Rankins, Bill Salinas, MD   Reason for Endocrinology Evaluation: Gynecomastia     Initial Endocrinology Clinic Visit: 07/21/2019    PATIENT IDENTIFIER: Shane Sanchez is a 59 y.o., male with a past medical history of HTN and T2DM. He has followed with [facility] since 07/21/2019 for consultative assistance with management of his gynecomastia.   HISTORICAL SUMMARY:  Pt was noted with gynecomastia in 03/2018. He did have Testosterone checked through an outside endocrinologist taken at 10 AM at 151.3 ng/dL. Repeat levels at our office showed a testosterone level of 222.7 ng/dL, and low free testosterone at 40 pg/mL, and minimally elevated estradiol at 43.7 pg/mL, and  normal LH, FSH, and prolactin.  Patient had scrotal ultrasound which demonstrated a small left hydrocele, CT scan did not show any adrenal pathology but did show a focal area of steatosis in the liver.  He was given the option of starting testosterone therapy, but patient opted with lifestyle changes and losing weight to help improve his hormonal profile.  He was also advised to cut down on alcohol intake.  He is a Automotive engineer.     SUBJECTIVE:    Today (01/17/2020):  Shane Sanchez is here for follow-up on gynecomastia and hypogonadism.  Patient has been exercising and making lifestyle changes, he has lost 7 pounds since his last visit here.  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    ROS:  As per HPI.   HISTORY:  Past Medical History:  Past Medical History:  Diagnosis Date  . Eczema   . High blood pressure   . Psoriasis    Past Surgical History:  Past Surgical History:  Procedure Laterality Date  . CYST EXCISION     Sebaceous cyst in back.  . WISDOM  TOOTH EXTRACTION      Social History:  reports that he quit smoking about 26 years ago. His smoking use included cigarettes. He has never used smokeless tobacco. He reports current alcohol use. He reports that he does not use drugs. Family History:  Family History  Problem Relation Age of Onset  . High blood pressure Mother   . Stroke Mother   . Stroke Father   . Heart Problems Father      HOME MEDICATIONS: Allergies as of 01/17/2020   No Known Allergies     Medication List       Accurate as of January 17, 2020  2:34 PM. If you have any questions, ask your nurse or doctor.        atorvastatin 40 MG tablet Commonly known as: LIPITOR TK 1 T PO QD   fluticasone 50 MCG/ACT nasal spray Commonly known as: FLONASE SHAKE LIQUID AND USE 2 SPRAYS IN EACH NOSTRIL DAILY   losartan-hydrochlorothiazide 100-25 MG tablet Commonly known as: HYZAAR TK 1 T PO QD   metFORMIN 500 MG tablet Commonly known as: GLUCOPHAGE TK 1 T PO QD WAC   montelukast 10 MG tablet Commonly known as: SINGULAIR TAKE 1 TABLET(10 MG) BY MOUTH DAILY         OBJECTIVE:   PHYSICAL EXAM: VS: BP 128/88 (BP Location: Left Arm, Patient Position: Sitting, Cuff Size: Large)   Pulse 82   Temp 98.1 F (36.7  C)   Ht 6' (1.829 m)   Wt 275 lb 12.8 oz (125.1 kg)   SpO2 98%   BMI 37.41 kg/m    EXAM: General: Pt appears well and is in NAD  Lungs: Clear with good BS bilat with no rales, rhonchi, or wheezes  Heart: Auscultation: RRR.  Breast exam: Reduction in the size of the left glandular tissue. No gynecomastia on the right   Abdomen: Normoactive bowel sounds, soft, nontender, without masses or organomegaly palpable  Extremities:  BL LE: Trace pretibial edema normal ROM and strength.  Skin: Hair: Texture and amount normal with gender appropriate distribution Skin Inspection: No rashes, acanthosis nigricans/skin tags. No lipohypertrophy Skin Palpation: Skin temperature, texture, and thickness normal to  palpation  Mental Status: Judgment, insight: Intact Orientation: Oriented to time, place, and person Mood and affect: No depression, anxiety, or agitation     DATA REVIEWED: Results for Shane Sanchez, Shane Sanchez (MRN [id number]) as of 01/17/2020 14:28  Ref. Range 07/26/2019 08:02  LH Latest Ref Range: 1.50 - 9.30 mIU/mL 2.68  FSH Latest Ref Range: 1.4 - 18.1 mIU/ML 3.6  Prolactin Latest Ref Range: 4.0 - 15.2 ng/mL 14.3  Glucose Latest Ref Range: 70 - 99 mg/dL 528 (H)  Estradiol Latest Ref Range: 7.6 - 42.6 pg/mL 43.7 (H)  Testosterone, total Latest Ref Range: 264.0 - 916.0 ng/dL 413.2 (L)  Testosterone-% Free Latest Units: % 1.8  Testosterone, Serum (Total) Latest Units: ng/dL 440 (L)  Free Testosterone, Serum Latest Units: pg/mL 40 (L)  TSH Latest Ref Range: 0.35 - 4.50 uIU/mL 2.93  T4,Free(Direct) Latest Ref Range: 0.60 - 1.60 ng/dL 1.02    ASSESSMENT / PLAN / RECOMMENDATIONS:   1. Gynecomastia/ Hypogonadism:   -We have reviewed his adrenal imaging and scrotal ultrasound, which showed no adrenal pathology and small left hydrocele. -Patient was offered testosterone therapy back in November 2020 due to reduction in his free and total testosterone, but he opted to hold off on that at the time and concentrate on lifestyle changes. -  We did discuss that has minimal elevation of estradiol is due to fatty and hepatic origin. -I have praised the patient on lifestyle changes and weight loss, I have encouraged him to continue and to try and exercise at 170 minutes/week.   -I also have encouraged him to continue reducing alcohol consumption.   -At this point he has no evidence of worsening gynecomastia, and he has no evidence of erectile dysfunction, or decreased libido. -Patient encouraged to establish with endocrinology once he is settled in [place].  Follow-up as needed  Signed electronically by: Lyndle Herrlich, MD  [facility] Endocrinology  [facility] [address] [place] [place], [place] [address] Phone: [phone] FAX: [phone]      CC: Clayborn Heron, MD [address] [place] [address] Phone: [phone]  Fax: [phone]   Return to Endocrinology clinic as below: Future Appointments  Date Time Provider Department Center  01/24/2020  3:15 PM Kozlow, Alvira Philips, MD [facility] None

## 2020-01-24 ENCOUNTER — Ambulatory Visit: Payer: Self-pay

## 2020-01-24 ENCOUNTER — Encounter: Payer: Self-pay | Admitting: Allergy and Immunology

## 2020-01-24 ENCOUNTER — Other Ambulatory Visit: Payer: Self-pay

## 2020-01-24 ENCOUNTER — Ambulatory Visit: Payer: BC Managed Care – PPO | Admitting: Allergy and Immunology

## 2020-01-24 VITALS — BP 130/90 | HR 84 | Temp 98.0°F | Resp 18

## 2020-01-24 DIAGNOSIS — T781XXD Other adverse food reactions, not elsewhere classified, subsequent encounter: Secondary | ICD-10-CM

## 2020-01-24 DIAGNOSIS — J301 Allergic rhinitis due to pollen: Secondary | ICD-10-CM

## 2020-01-24 DIAGNOSIS — J3089 Other allergic rhinitis: Secondary | ICD-10-CM

## 2020-01-24 DIAGNOSIS — J309 Allergic rhinitis, unspecified: Secondary | ICD-10-CM

## 2020-01-24 MED ORDER — FLUTICASONE PROPIONATE 50 MCG/ACT NA SUSP: NASAL | 1 refills | Status: AC

## 2020-01-24 MED ORDER — MONTELUKAST SODIUM 10 MG PO TABS: 10.0000 mg | ORAL_TABLET | Freq: Every day | ORAL | 1 refills | Status: DC

## 2020-01-24 NOTE — Patient Instructions (Addendum)
  1. Continue to perform Allergen avoidance measures as best as possible  2. Continue to Treat and prevent inflammation:   A. nasal fluticasone 1-2 sprays each nostril one time per day  B. montelukast 10 mg tablet 1 time per day  3. If needed:   A. OTC antihistamine - Loratadine 10 mg - 1 tablet 1 time per day  B. Nasal saline  4.  Continue immunotherapy and EpiPen/Auvi-Q  5. Return to clinic 12 months or earlier if problem  6. Obtain second Covid vaccine

## 2020-01-24 NOTE — Progress Notes (Signed)
[facility] [place]   Follow-up Note  Referring Provider: Aretta Nip, MD Primary Provider: Aretta Nip, MD Date of Office Visit: 01/24/2020  Subjective:   Shane Sanchez (DOB: Mar 26, 1961) is a 59 y.o. male who returns to the [place] on 01/24/2020 in re-evaluation of the following:  HPI: Barack returns to this clinic in evaluation of allergic rhinoconjunctivitis and oral allergy syndrome treated with immunotherapy.  His last visit to this clinic was 01 February 2019.  Davinder continues to receive immunotherapy for his allergic rhinoconjunctivitis and oral allergy syndrome.  He has not had any adverse effect while utilizing this form of therapy.  He is currently at every 3-week administration.  About 2 weeks ago he was exposed to hand lotion that had some type of plant product contained with it and he started immediately sneezing and having nasal congestion and a sinus headaches and fortunately that hand lotion has been removed from his environment and he is doing well at this point.  He has been having a little bit of nasal congestion and may be some slight sneezing since the pollen season has arrived but he continues to use his nasal fluticasone and montelukast on a consistent basis and he thinks overall he is doing well.  He has received 1 Covid vaccination to date.  Allergies as of 01/24/2020   No Known Allergies     Medication List      atorvastatin 40 MG tablet Commonly known as: LIPITOR TK 1 T PO QD   fluticasone 50 MCG/ACT nasal spray Commonly known as: FLONASE SHAKE LIQUID AND USE 2 SPRAYS IN EACH NOSTRIL DAILY   losartan-hydrochlorothiazide 100-25 MG tablet Commonly known as: HYZAAR TK 1 T PO QD   metFORMIN 500 MG tablet Commonly known as: GLUCOPHAGE TK 1 T PO QD WAC   montelukast 10 MG tablet Commonly known as: SINGULAIR TAKE 1 TABLET(10 MG) BY MOUTH DAILY       Past Medical History:   Diagnosis Date  . Eczema   . High blood pressure   . Psoriasis     Past Surgical History:  Procedure Laterality Date  . CYST EXCISION     Sebaceous cyst in back.  . WISDOM TOOTH EXTRACTION      Review of systems negative except as noted in HPI / PMHx or noted below:  Review of Systems  Constitutional: Negative.   HENT: Negative.   Eyes: Negative.   Respiratory: Negative.   Cardiovascular: Negative.   Gastrointestinal: Negative.   Genitourinary: Negative.   Musculoskeletal: Negative.   Skin: Negative.   Neurological: Negative.   Endo/Heme/Allergies: Negative.   Psychiatric/Behavioral: Negative.      Objective:   Vitals:   01/24/20 1527  BP: 130/90  Pulse: 84  Resp: 18  Temp: 98 F (36.7 C)  SpO2: 97%          Physical Exam Constitutional:      Appearance: He is not diaphoretic.  HENT:     Head: Normocephalic.     Right Ear: Tympanic membrane, ear canal and external ear normal.     Left Ear: Tympanic membrane, ear canal and external ear normal.     Nose: Nose normal. No mucosal edema or rhinorrhea.     Mouth/Throat:     Pharynx: Uvula midline. No oropharyngeal exudate.  Eyes:     Conjunctiva/sclera: Conjunctivae normal.  Neck:     Thyroid: No thyromegaly.  Trachea: Trachea normal. No tracheal tenderness or tracheal deviation.  Cardiovascular:     Rate and Rhythm: Normal rate and regular rhythm.     Heart sounds: Normal heart sounds, S1 normal and S2 normal. No murmur.  Pulmonary:     Effort: No respiratory distress.     Breath sounds: Normal breath sounds. No stridor. No wheezing or rales.  Lymphadenopathy:     Head:     Right side of head: No tonsillar adenopathy.     Left side of head: No tonsillar adenopathy.     Cervical: No cervical adenopathy.  Skin:    Findings: No erythema or rash.     Nails: There is no clubbing.  Neurological:     Mental Status: He is alert.     Diagnostics: none  Assessment and Plan:   1. Perennial  allergic rhinitis   2. Seasonal allergic rhinitis due to pollen   3. Pollen-food allergy, subsequent encounter     1. Continue to perform Allergen avoidance measures as best as possible  2. Continue to Treat and prevent inflammation:   A. nasal fluticasone 1-2 sprays each nostril one time per day  B. montelukast 10 mg tablet 1 time per day  3. If needed:   A. OTC antihistamine - Loratadine 10 mg - 1 tablet 1 time per day  B. Nasal saline  4.  Continue immunotherapy and EpiPen/Auvi-Q  5. Return to clinic 12 months or earlier if problem  6. Obtain second Covid vaccine  Tupac will continue to utilize immunotherapy and anti-inflammatory agents for his airway in the form of a nasal steroid and leukotriene modifier as noted above.  We will hold off on any systemic steroid administration at this point as he will be receiving his second Covid vaccination within a few weeks.  He will keep in contact with me noting his response to this approach and I will see him back in his clinic in 12 months or earlier if there is a problem.  Laurette Schimke, MD Allergy / Immunology [facility]

## 2020-01-25 ENCOUNTER — Encounter: Payer: Self-pay | Admitting: Allergy and Immunology

## 2020-01-31 ENCOUNTER — Ambulatory Visit (INDEPENDENT_AMBULATORY_CARE_PROVIDER_SITE_OTHER): Payer: BC Managed Care – PPO | Admitting: *Deleted

## 2020-01-31 DIAGNOSIS — J309 Allergic rhinitis, unspecified: Secondary | ICD-10-CM

## 2020-02-07 ENCOUNTER — Ambulatory Visit (INDEPENDENT_AMBULATORY_CARE_PROVIDER_SITE_OTHER): Payer: BC Managed Care – PPO | Admitting: *Deleted

## 2020-02-07 DIAGNOSIS — J309 Allergic rhinitis, unspecified: Secondary | ICD-10-CM

## 2020-02-21 ENCOUNTER — Ambulatory Visit (INDEPENDENT_AMBULATORY_CARE_PROVIDER_SITE_OTHER): Payer: BC Managed Care – PPO

## 2020-02-21 DIAGNOSIS — J309 Allergic rhinitis, unspecified: Secondary | ICD-10-CM | POA: Diagnosis not present

## 2020-03-06 ENCOUNTER — Ambulatory Visit (INDEPENDENT_AMBULATORY_CARE_PROVIDER_SITE_OTHER): Payer: BC Managed Care – PPO | Admitting: *Deleted

## 2020-03-06 DIAGNOSIS — J309 Allergic rhinitis, unspecified: Secondary | ICD-10-CM

## 2020-03-22 ENCOUNTER — Ambulatory Visit (INDEPENDENT_AMBULATORY_CARE_PROVIDER_SITE_OTHER): Payer: BC Managed Care – PPO

## 2020-03-22 DIAGNOSIS — J309 Allergic rhinitis, unspecified: Secondary | ICD-10-CM | POA: Diagnosis not present

## 2020-04-01 ENCOUNTER — Other Ambulatory Visit: Payer: Self-pay | Admitting: Allergy and Immunology

## 2020-04-11 ENCOUNTER — Ambulatory Visit (INDEPENDENT_AMBULATORY_CARE_PROVIDER_SITE_OTHER): Payer: BC Managed Care – PPO

## 2020-04-11 DIAGNOSIS — J309 Allergic rhinitis, unspecified: Secondary | ICD-10-CM | POA: Diagnosis not present

## 2020-05-01 ENCOUNTER — Ambulatory Visit (INDEPENDENT_AMBULATORY_CARE_PROVIDER_SITE_OTHER): Payer: BC Managed Care – PPO

## 2020-05-01 DIAGNOSIS — J309 Allergic rhinitis, unspecified: Secondary | ICD-10-CM

## 2020-05-07 NOTE — Progress Notes (Signed)
EXP 05/08/21 

## 2020-05-09 DIAGNOSIS — J3089 Other allergic rhinitis: Secondary | ICD-10-CM | POA: Diagnosis not present

## 2020-05-10 DIAGNOSIS — Z125 Encounter for screening for malignant neoplasm of prostate: Secondary | ICD-10-CM | POA: Diagnosis not present

## 2020-05-10 DIAGNOSIS — L409 Psoriasis, unspecified: Secondary | ICD-10-CM | POA: Diagnosis not present

## 2020-05-10 DIAGNOSIS — E1159 Type 2 diabetes mellitus with other circulatory complications: Secondary | ICD-10-CM | POA: Diagnosis not present

## 2020-05-10 DIAGNOSIS — Z0000 Encounter for general adult medical examination without abnormal findings: Secondary | ICD-10-CM | POA: Diagnosis not present

## 2020-05-10 DIAGNOSIS — E782 Mixed hyperlipidemia: Secondary | ICD-10-CM | POA: Diagnosis not present

## 2020-05-10 DIAGNOSIS — I10 Essential (primary) hypertension: Secondary | ICD-10-CM | POA: Diagnosis not present

## 2020-06-04 ENCOUNTER — Ambulatory Visit (INDEPENDENT_AMBULATORY_CARE_PROVIDER_SITE_OTHER): Payer: BC Managed Care – PPO

## 2020-06-04 DIAGNOSIS — J309 Allergic rhinitis, unspecified: Secondary | ICD-10-CM | POA: Diagnosis not present

## 2020-07-13 ENCOUNTER — Telehealth: Payer: Self-pay | Admitting: *Deleted

## 2020-07-13 NOTE — Telephone Encounter (Signed)
Received fax from [facility] on behalf of patient asking to transfer his allergy vials to their office. Called and confirmed with patient that this is what he wanted. Patient stated that he has moved to [place], [place] and he would like for his allergy vials to be transferred to their office. I informed him that once I send our portion of paperwork to their office and they sign and send it back I will work on the process of mailing his vials to their office. Patient verbalized understanding. I have completed their paperwork that they wanted completed and filled out our outside office forms and faxed them to their office at [phone] asking for their office to complete our office and fax them back. Paperwork has been placed in the blue folder in the [place] injection room for now.

## 2020-07-18 NOTE — Telephone Encounter (Signed)
Paperwork has been re-faxed to [facility]. Currently waiting for reply.

## 2020-07-19 NOTE — Telephone Encounter (Signed)
Received a fax back from [facility] stating that the patient has decided to re-start his allergy injections at their office and that they no longer need our vials. I called the patient and spoke with his wife and she did confirm that he had allergy testing done this past Monday at their office and he was allergic to some new things so they are going to make new vials for him. His allergy vials here have been placed in the inactive tray just in case they may need them until they expire. All paperwork for correspondence with this office has been labeled and placed in bulk scanning.

## 2021-04-17 IMAGING — US US SCROTUM W/ DOPPLER COMPLETE
1 series · 14 of 25 positions shown · non-contrast
Comparison: None

CLINICAL DATA: Gynecomastia, elevated estradiol

EXAM:
SCROTAL ULTRASOUND
DOPPLER ULTRASOUND OF THE TESTICLES
TECHNIQUE: Complete ultrasound examination of the testicles, epididymis, and
other scrotal structures was performed. Color and spectral Doppler
ultrasound were also utilized to evaluate blood flow to the
testicles.

[Series 1: us scrotum w/ doppler complete · 0.08mm/px · 14 of 48 slices shown]
[im 1/48]
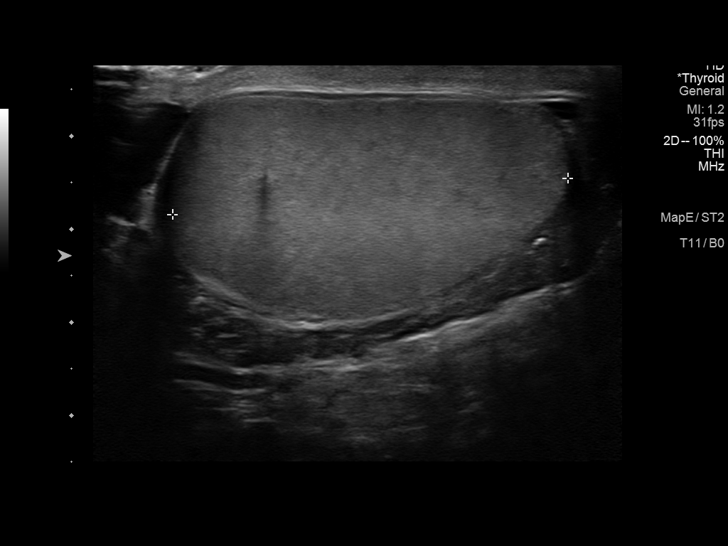
[im 4/48]
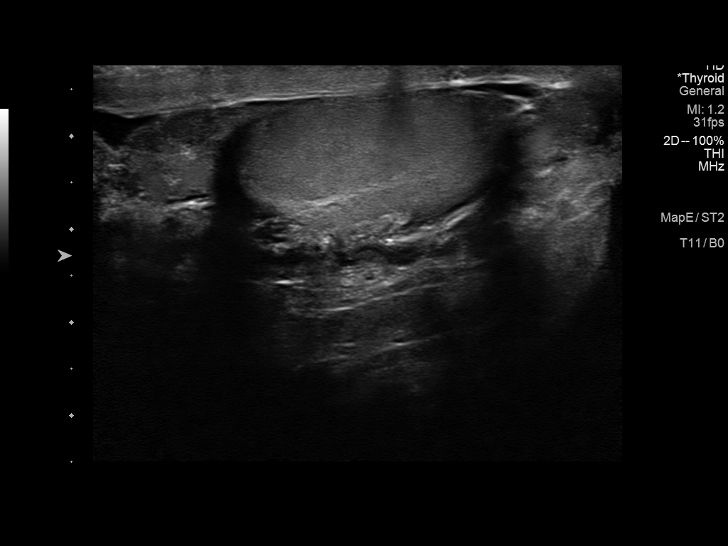
[im 8/48]
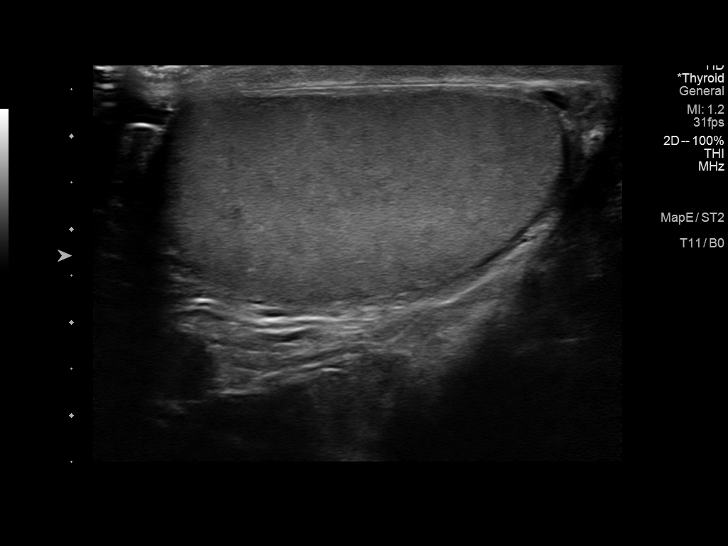
[im 12/48]
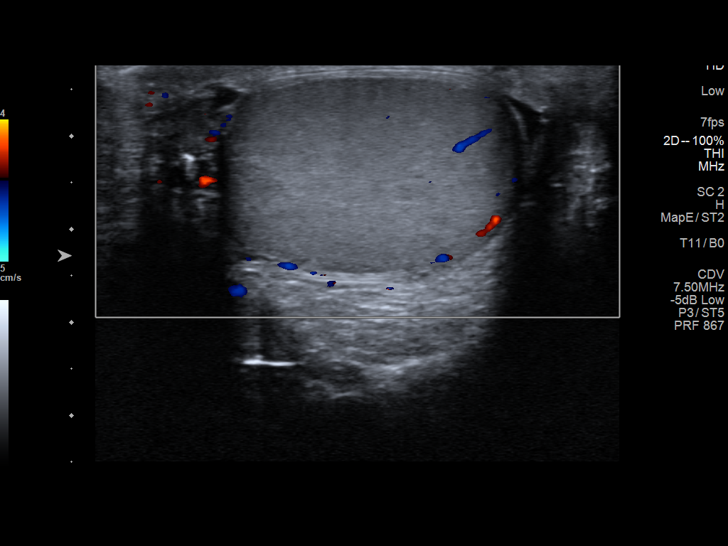
[im 16/48]
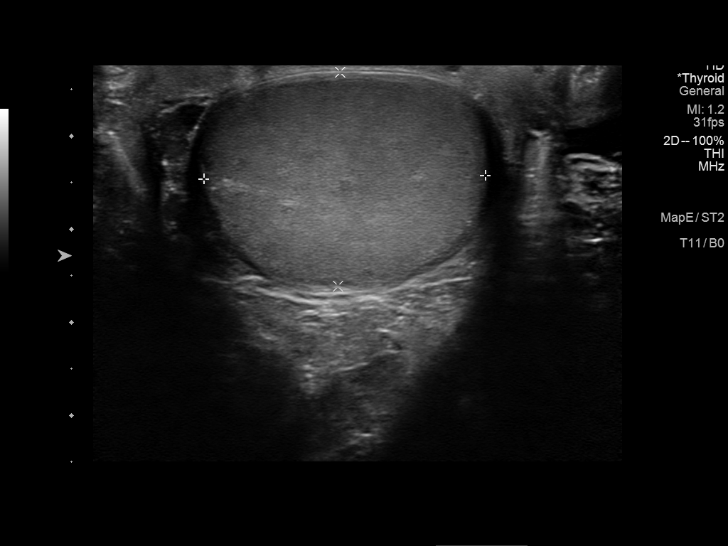
[im 18/48]
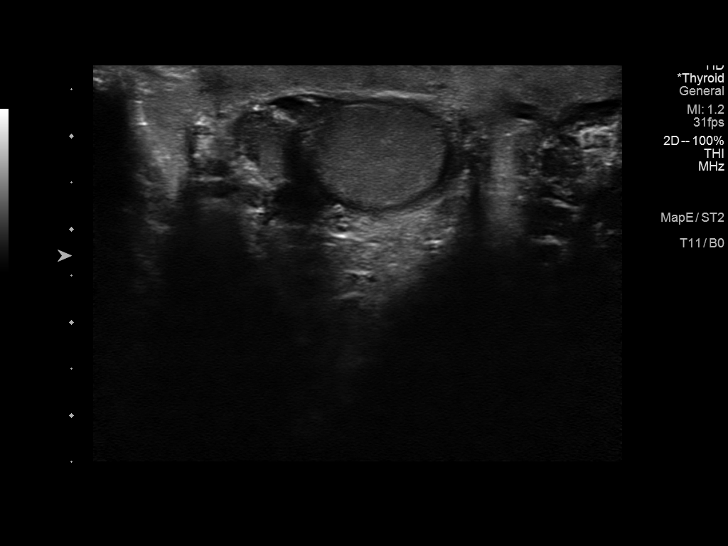
[im 22/48]
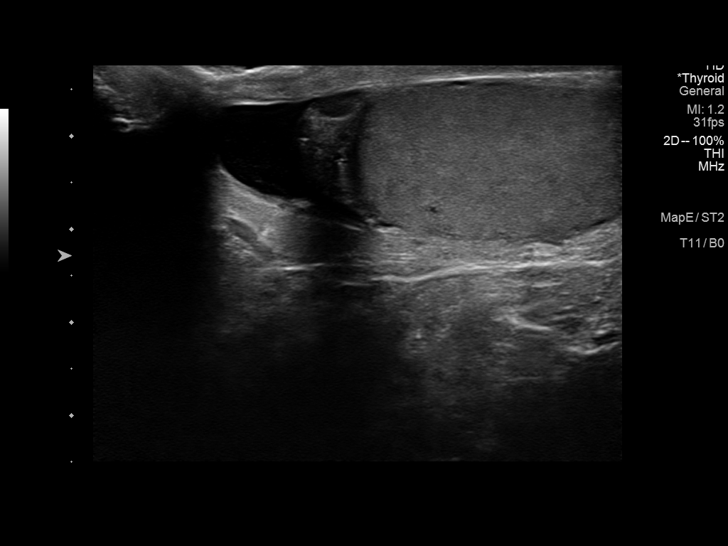
[im 26/48]
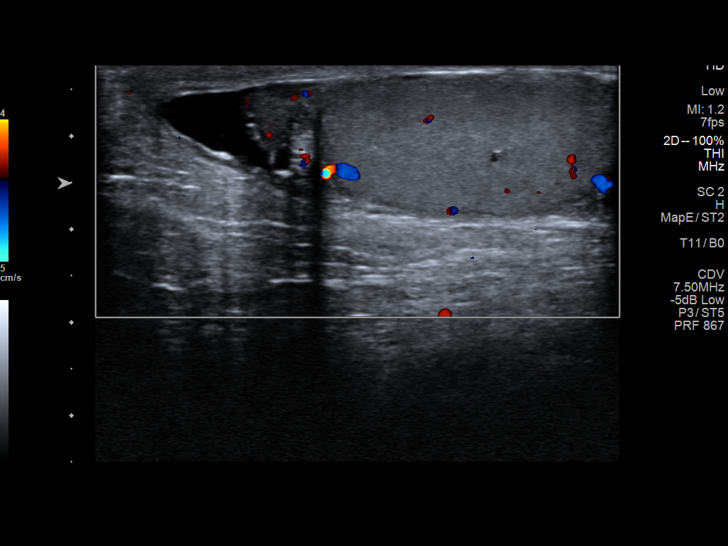
[im 30/48]
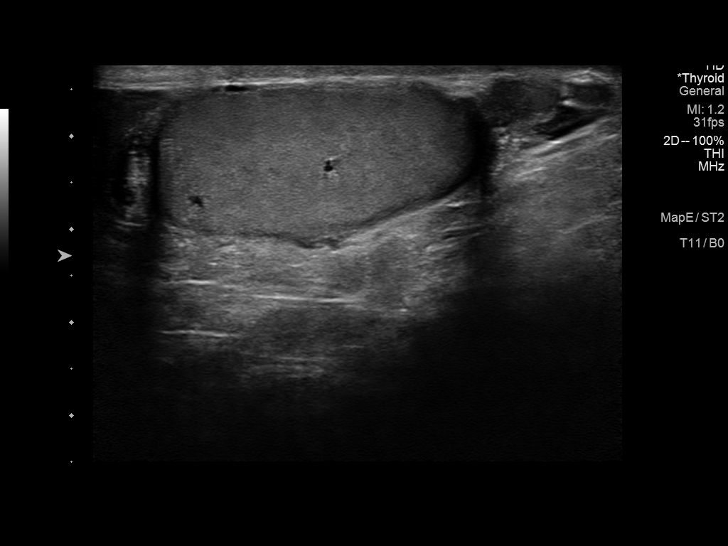
[im 32/48]
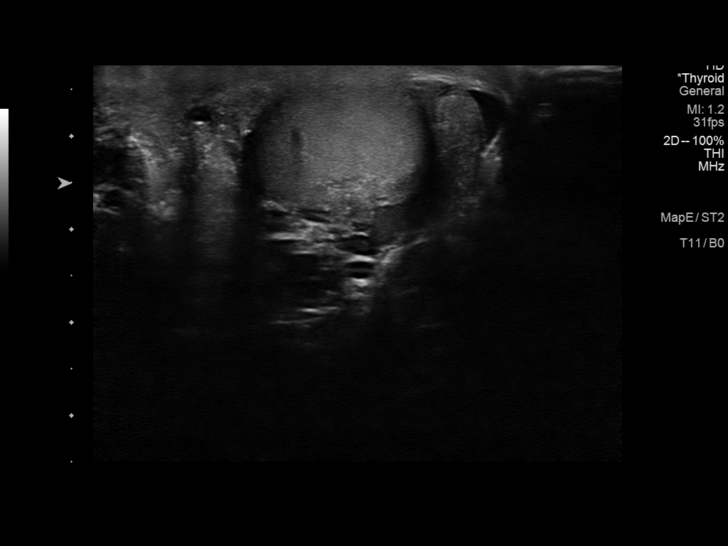
[im 36/48]
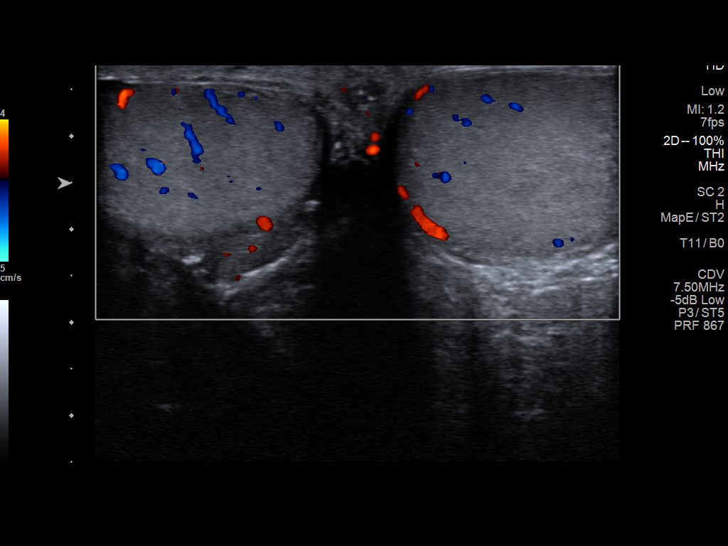
[im 40/48]
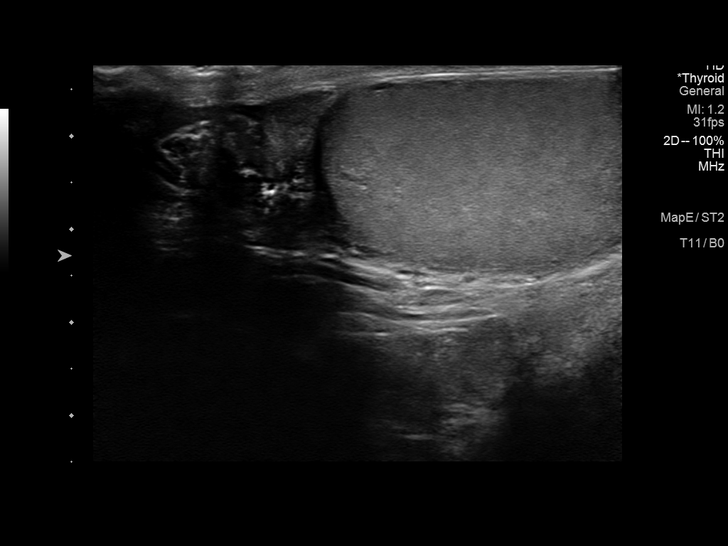
[im 44/48]
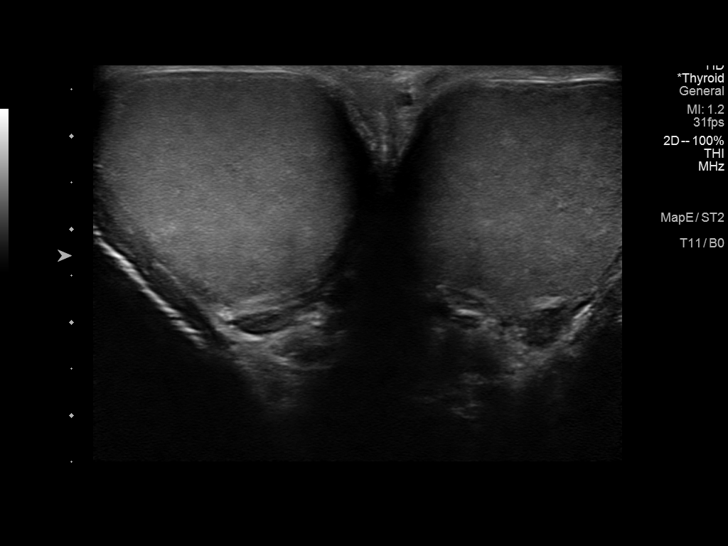
[im 48/48]
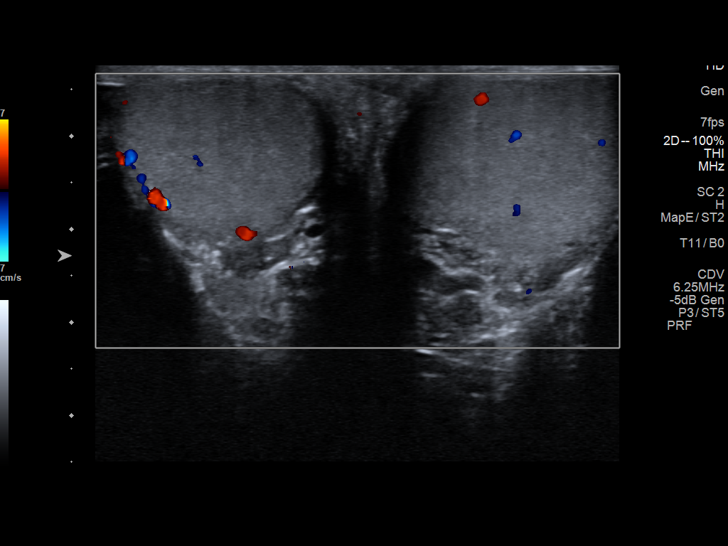

[14 of 25 positions shown; findings below may reference images not displayed]

FINDINGS: Right testicle

Measurements: 4.3 x 2.3 x 3.0 cm. Normal echogenicity without mass
or calcification. Internal blood flow present on color Doppler
imaging.

Left testicle

Measurements: 4.3 x 2.6 x 2.7 cm. Normal echogenicity. Tiny central
calcification. Tiny hypoechoic focus question cyst centrally in
RIGHT testis 1.2 mm diameter. No additional mass.

Right epididymis:  Normal in size and appearance.

Left epididymis:  Minimal LEFT hydrocele

Hydrocele:  None visualized.

Varicocele:  None visualized.

Pulsed Doppler interrogation of both testes demonstrates normal low
resistance arterial and venous waveforms bilaterally.
IMPRESSION: Minimal LEFT hydrocele.

Otherwise negative scrotal ultrasound.
